# Patient Record
Sex: Male | Born: 1995
Health system: Southern US, Community
[De-identification: ages and names within clinical notes are randomized; demographics above are authoritative.]

## PROBLEM LIST (undated history)

## (undated) DIAGNOSIS — J45909 Unspecified asthma, uncomplicated: Secondary | ICD-10-CM

## (undated) DIAGNOSIS — F988 Other specified behavioral and emotional disorders with onset usually occurring in childhood and adolescence: Secondary | ICD-10-CM

## (undated) HISTORY — DX: Other specified behavioral and emotional disorders with onset usually occurring in childhood and adolescence: F98.8

---
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
## 2016-01-25 ENCOUNTER — Other Ambulatory Visit: Payer: Self-pay | Admitting: Physician Assistant

## 2016-03-03 ENCOUNTER — Telehealth: Payer: Self-pay

## 2016-03-03 NOTE — Telephone Encounter (Signed)
Pt would like a refill on his amphetamine-dextroamphetamine (ADDERALL) 20 MG tablet [[id number]][[id number]]. Please advise at [phone]

## 2016-03-06 NOTE — Telephone Encounter (Signed)
i see 3 rx starting 12/29 Not time yet

## 2016-03-06 NOTE — Telephone Encounter (Signed)
Unable to reach.

## 2016-03-14 ENCOUNTER — Telehealth: Payer: Self-pay

## 2016-03-14 NOTE — Telephone Encounter (Signed)
Pt is need a refill on his adderall   Best number [phone]

## 2016-03-14 NOTE — Telephone Encounter (Signed)
Due march 29

## 2016-03-21 NOTE — Telephone Encounter (Signed)
Looking back in his chart, he was last seen in October He has now had 6 months worth of medication and is due for a follow-up before refills I can see him on Friday or Monday Let him know that I retire in May I can set suggest places for him to follow-up after my retirement--in particular [facility] in [place]

## 2016-03-22 ENCOUNTER — Telehealth: Payer: Self-pay

## 2016-03-22 NOTE — Telephone Encounter (Signed)
Patient's brother called to clarify that the patient is out of the country, currently at school in [place], and he cannot come in for a follow up.  There is no ETA on when he will return to [place].  Please advise on how to handle the Adderall refills.  Thank you.  CB#: [phone]

## 2016-03-22 NOTE — Telephone Encounter (Signed)
Called pt, unable to leave voicemail

## 2016-03-22 NOTE — Telephone Encounter (Signed)
He has to come in. Correct Dr. Merla Richesoolittle?

## 2016-03-23 MED ORDER — AMPHETAMINE-DEXTROAMPHETAMINE 20 MG PO TABS: 20.0000 mg | ORAL_TABLET | Freq: Two times a day (BID) | ORAL | Status: DC

## 2016-03-23 NOTE — Telephone Encounter (Signed)
Meds ordered this encounter  Medications  . amphetamine-dextroamphetamine (ADDERALL) 20 MG tablet    Sig: Take 1 tablet (20 mg total) by mouth 2 (two) times daily.    Dispense:  60 tablet    Refill:  0   So--next rx will have to be handled in whatever country he's living in Remind them i am retiring and need to refer them to a new ADD specialist at the [facility] in May

## 2016-03-23 NOTE — Telephone Encounter (Signed)
Tried to call brother to advise Rx is ready and give Dr Doolittle's message but VM was full. His voice message allowed me to leave my ph #, so hopefully he will call back.

## 2016-07-12 ENCOUNTER — Ambulatory Visit: Payer: Self-pay

## 2016-09-26 ENCOUNTER — Ambulatory Visit: Payer: BLUE CROSS/BLUE SHIELD

## 2016-11-08 ENCOUNTER — Ambulatory Visit (INDEPENDENT_AMBULATORY_CARE_PROVIDER_SITE_OTHER): Payer: Self-pay | Admitting: Physician Assistant

## 2016-11-08 VITALS — BP 112/80 | HR 70 | Temp 98.4°F | Resp 16 | Ht 68.0 in | Wt 178.0 lb

## 2016-11-08 DIAGNOSIS — F988 Other specified behavioral and emotional disorders with onset usually occurring in childhood and adolescence: Secondary | ICD-10-CM

## 2016-11-08 MED ORDER — AMPHETAMINE-DEXTROAMPHETAMINE 20 MG PO TABS: 20.0000 mg | ORAL_TABLET | Freq: Two times a day (BID) | ORAL | 0 refills | Status: DC

## 2016-11-08 NOTE — Patient Instructions (Addendum)
  Follow up with me in one month for prescription refill. I have appointments on Wednesday afternoons in our adjacent building starting in December. Therefore you can either make an appointment for December 6 or 13th depending on how you take the adderall this month.   I would start with just taking half a tablet twice a day and adjust dose accordingly. If you have any questions please let us know.   Thank you for letting me participate in your health and well being.    IF you received an x-ray today, you will receive an invoice from [facility]. Please contact [facility] at [phone] with questions or concerns regarding your invoice.   IF you received labwork today, you will receive an invoice from [facility]. Please contact [facility] at [phone] with questions or concerns regarding your invoice.   Our billing staff will not be able to assist you with questions regarding bills from these companies.  You will be contacted with the lab results as soon as they are available. The fastest way to get your results is to activate your My Chart account. Instructions are located on the last page of this paperwork. If you have not heard from us regarding the results in 2 weeks, please contact this office.

## 2016-11-08 NOTE — Progress Notes (Signed)
   Mitchell Nelson  MRN: [id number] DOB: [birth year]  Subjective:  Mitchell Nelson is a 21 y.o. male seen in office today for a chief complaint of medication refill for Adderral. He has been taking adderall for ADD since 2015. Initially started on 10mg  BID and increased to a maintenance dose of 20mg  BID. He took a semester off of school to go visit family in [place] so he has not a prescription refilled since 03/23/2016. Pt is working at [employer] and will start school back in January at [employer] as a Sophomore so he would like to be back on the medication. He has no other concerns or complaints today.   Review of Systems  Constitutional: Negative for chills, diaphoresis and fever.  HENT: Negative for congestion.   Respiratory: Negative for cough.   Cardiovascular: Negative for chest pain and palpitations.  Gastrointestinal: Negative for nausea and vomiting.    Patient Active Problem List   Diagnosis Date Noted  . ADD (attention deficit disorder) 03/28/2015    Current Outpatient Prescriptions on File Prior to Visit  Medication Sig Dispense Refill  . amphetamine-dextroamphetamine (ADDERALL) 20 MG tablet Take 1 tablet (20 mg total) by mouth 2 (two) times daily. For 60d after signed 60 tablet 0  . amphetamine-dextroamphetamine (ADDERALL) 20 MG tablet Take 1 tablet (20 mg total) by mouth 2 (two) times daily. For 30d after signed 60 tablet 0  . amphetamine-dextroamphetamine (ADDERALL) 20 MG tablet Take 1 tablet (20 mg total) by mouth 2 (two) times daily. 60 tablet 0   No current facility-administered medications on file prior to visit.     No Known Allergies   Objective:  BP 112/80 (BP Location: Right Arm, Patient Position: Sitting, Cuff Size: Normal)   Pulse 70   Temp 98.4 F (36.9 C) (Oral)   Resp 16   Ht 5\' 8"  (1.727 m)   Wt 178 lb (80.7 kg)   SpO2 99%   BMI 27.06 kg/m   Physical Exam  Constitutional: He is oriented to person, place, and time and well-developed,  well-nourished, and in no distress.  HENT:  Head: Normocephalic and atraumatic.  Eyes: Conjunctivae are normal.  Neck: Normal range of motion.  Cardiovascular: Normal rate, regular rhythm, normal heart sounds and intact distal pulses.   Pulmonary/Chest: Effort normal and breath sounds normal.  Neurological: He is alert and oriented to person, place, and time. Gait normal.  Skin: Skin is warm and dry.  Psychiatric: Affect normal.  Vitals reviewed.   Assessment and Plan :  1. Attention deficit disorder, unspecified hyperactivity presence - amphetamine-dextroamphetamine (ADDERALL) 20 MG tablet; Take 1 tablet (20 mg total) by mouth 2 (two) times daily.  Dispense: 60 tablet; Refill: 0 -Pt instructed to start out with 10mg  BID as he has not taken it in 8 months and he can increase up to 20mg  BID.  -Pt to follow up with me in 4 weeks for prescription refill. Will give him three months worth of prescriptions at this visit if he is tolerating medication appropriately.   Benjiman CoreBrittany Wiseman PA-C  [facility] 11/08/2016 12:20 PM

## 2016-12-21 ENCOUNTER — Ambulatory Visit (INDEPENDENT_AMBULATORY_CARE_PROVIDER_SITE_OTHER): Payer: Self-pay | Admitting: Physician Assistant

## 2016-12-21 VITALS — BP 114/68 | HR 60 | Temp 98.5°F | Resp 18 | Ht 68.0 in | Wt 178.0 lb

## 2016-12-21 DIAGNOSIS — F988 Other specified behavioral and emotional disorders with onset usually occurring in childhood and adolescence: Secondary | ICD-10-CM

## 2016-12-21 MED ORDER — AMPHETAMINE-DEXTROAMPHETAMINE 20 MG PO TABS: 20.0000 mg | ORAL_TABLET | Freq: Two times a day (BID) | ORAL | 0 refills | Status: DC

## 2016-12-21 NOTE — Progress Notes (Signed)
Mitchell Nelson  MRN: [id number] DOB: Dec 08, 1995  Subjective:  Mitchell Nelson is a [age] y.o. male seen in office today for a chief complaint of medication refill for Adderall 20mg  BID for ADD. Pt initially seen by me on 11/08/16 and I restarted Adderall 20mg  BID (by titrating up from 10mg  BID) after he took a semester off from school to visit his family in [place]. Pt is working at [employer] and is starting back school in January at [employer]. Pt notes he is taking it as prescribed. He will sometimes not take it on the weekends if he is not working. Denies decreased appetite, palpitations, nausea, and vomiting.   Review of Systems  Constitutional: Negative for chills, diaphoresis, fatigue and fever.  Respiratory: Negative for cough and shortness of breath.   Cardiovascular: Negative for chest pain.  Gastrointestinal: Negative for abdominal pain, constipation and diarrhea.  Neurological: Negative for dizziness and headaches.    Patient Active Problem List   Diagnosis Date Noted  . ADD (attention deficit disorder) 03/28/2015    Current Outpatient Prescriptions on File Prior to Visit  Medication Sig Dispense Refill  . amphetamine-dextroamphetamine (ADDERALL) 20 MG tablet Take 1 tablet (20 mg total) by mouth 2 (two) times daily. For 30d after signed 60 tablet 0  . amphetamine-dextroamphetamine (ADDERALL) 20 MG tablet Take 1 tablet (20 mg total) by mouth 2 (two) times daily. 60 tablet 0  . amphetamine-dextroamphetamine (ADDERALL) 20 MG tablet Take 1 tablet (20 mg total) by mouth 2 (two) times daily. 60 tablet 0   No current facility-administered medications on file prior to visit.     No Known Allergies   Objective:  BP 114/68 (BP Location: Right Arm, Patient Position: Sitting, Cuff Size: Small)   Pulse 60   Temp 98.5 F (36.9 C) (Oral)   Resp 18   Ht 5\' 8"  (1.727 m)   Wt 178 lb (80.7 kg)   SpO2 97%   BMI 27.06 kg/m   Physical Exam  Constitutional: He is oriented to person,  place, and time and well-developed, well-nourished, and in no distress.  HENT:  Head: Normocephalic and atraumatic.  Eyes: Conjunctivae are normal.  Neck: Normal range of motion.  Cardiovascular: Normal rate, regular rhythm and normal heart sounds.   Pulmonary/Chest: Effort normal and breath sounds normal.  Neurological: He is alert and oriented to person, place, and time. Gait normal.  Skin: Skin is warm and dry.  Psychiatric: Affect normal.  Vitals reviewed.    Wt Readings from Last 3 Encounters:  12/21/16 178 lb (80.7 kg)  11/08/16 178 lb (80.7 kg)  10/03/15 173 lb 9.6 oz (78.7 kg) (78 %, Z= 0.77)*   * Growth percentiles are based on CDC 2-20 Years data.   Assessment and Plan :  1. Attention deficit disorder, unspecified hyperactivity presence Given 3 month prescription. Follow up in 3 months for refills. Instructed to return sooner for annual physical exam as he has not had an annual in quite some time.  Meds ordered this encounter  Medications  . DISCONTD: amphetamine-dextroamphetamine (ADDERALL) 20 MG tablet    Sig: Take 1 tablet (20 mg total) by mouth 2 (two) times daily. For 30d after signed    Dispense:  60 tablet    Refill:  0    Order Specific Question:   Supervising Provider    Answer:   Ethelda ChickSMITH, KRISTI M [[id number]]  . amphetamine-dextroamphetamine (ADDERALL) 20 MG tablet    Sig: Take 1 tablet (20 mg total) by mouth 2 (  two) times daily.    Dispense:  60 tablet    Refill:  0    Do not refill before 01/21/2017    Order Specific Question:   Supervising Provider    Answer:   Ethelda ChickSMITH, KRISTI M [[id number]]  . amphetamine-dextroamphetamine (ADDERALL) 20 MG tablet    Sig: Take 1 tablet (20 mg total) by mouth 2 (two) times daily. For 30d after signed    Dispense:  60 tablet    Refill:  0    Do not refill before 02/21/2017    Order Specific Question:   Supervising Provider    Answer:   Nilda SimmerSMITH, KRISTI M [[id number]]     Mitchell CoreBrittany Kaylib Furness PA-C  [facility] 12/21/2016 5:44 PM

## 2016-12-21 NOTE — Patient Instructions (Addendum)
Follow up in 3 months for prescription refills. Return sooner for annual physical exam if you would like.   Happy holidays!!!     IF you received an x-ray today, you will receive an invoice from [facility]. Please contact [facility] at [phone] with questions or concerns regarding your invoice.   IF you received labwork today, you will receive an invoice from [facility]. Please contact [facility] at [phone] with questions or concerns regarding your invoice.   Our billing staff will not be able to assist you with questions regarding bills from these companies.  You will be contacted with the lab results as soon as they are available. The fastest way to get your results is to activate your My Chart account. Instructions are located on the last page of this paperwork. If you have not heard from us regarding the results in 2 weeks, please contact this office.

## 2017-01-12 ENCOUNTER — Encounter (HOSPITAL_COMMUNITY): Payer: Self-pay

## 2017-01-12 DIAGNOSIS — Y999 Unspecified external cause status: Secondary | ICD-10-CM | POA: Insufficient documentation

## 2017-01-12 DIAGNOSIS — W268XXA Contact with other sharp object(s), not elsewhere classified, initial encounter: Secondary | ICD-10-CM | POA: Insufficient documentation

## 2017-01-12 DIAGNOSIS — Z5321 Procedure and treatment not carried out due to patient leaving prior to being seen by health care provider: Secondary | ICD-10-CM | POA: Insufficient documentation

## 2017-01-12 DIAGNOSIS — Y9367 Activity, basketball: Secondary | ICD-10-CM | POA: Insufficient documentation

## 2017-01-12 DIAGNOSIS — S01121A Laceration with foreign body of right eyelid and periocular area, initial encounter: Secondary | ICD-10-CM | POA: Insufficient documentation

## 2017-01-12 DIAGNOSIS — Y929 Unspecified place or not applicable: Secondary | ICD-10-CM | POA: Insufficient documentation

## 2017-01-12 NOTE — ED Triage Notes (Signed)
Pt states that he was playing basketball and fell on his R side, pt has small laceration to his R eyebrow area from his glasses, unknown LOC, pain to his r shoulder and unable to move, no deformities noted.

## 2017-01-13 ENCOUNTER — Emergency Department (HOSPITAL_COMMUNITY)
Admission: EM | Admit: 2017-01-13 | Discharge: 2017-01-13 | Disposition: A | Discharge status: Home / Self Care | Payer: Self-pay | Attending: Emergency Medicine | Admitting: Emergency Medicine

## 2017-01-13 ENCOUNTER — Emergency Department (HOSPITAL_COMMUNITY): Payer: Self-pay

## 2017-01-13 NOTE — ED Notes (Signed)
Patient walked out with friends.

## 2017-02-25 ENCOUNTER — Telehealth: Payer: Self-pay

## 2017-02-25 NOTE — Telephone Encounter (Signed)
Patient came into clinic on 02/25/17 stating that he lost his amphetamine-dextroamphetamine (ADDERALL) 20 MG tablet and needs to get it filled again.  His call back number is [phone]

## 2017-02-25 NOTE — Telephone Encounter (Signed)
12/21/17 last 2 refills given

## 2017-02-26 NOTE — Telephone Encounter (Signed)
Pt checking on status of this message, please advise.

## 2017-02-27 NOTE — Telephone Encounter (Signed)
Patient was given a follow up appt with Barnett AbuWiseman for tomorrow

## 2017-02-28 ENCOUNTER — Ambulatory Visit (INDEPENDENT_AMBULATORY_CARE_PROVIDER_SITE_OTHER): Payer: Self-pay | Admitting: Physician Assistant

## 2017-02-28 DIAGNOSIS — F988 Other specified behavioral and emotional disorders with onset usually occurring in childhood and adolescence: Secondary | ICD-10-CM

## 2017-02-28 MED ORDER — AMPHETAMINE-DEXTROAMPHETAMINE 20 MG PO TABS: 20.0000 mg | ORAL_TABLET | Freq: Two times a day (BID) | ORAL | 0 refills | Status: DC

## 2017-02-28 NOTE — Patient Instructions (Addendum)
  Follow up in 3 months for annual physical exam with me.  In the meantime, come up with three things you are interested in majoring in at school and let me know at your next visit.  Keep up the hard work!!! Thank you for letting me participate in your health and well being.    IF you received an x-ray today, you will receive an invoice from [facility]. Please contact [facility] at [phone] with questions or concerns regarding your invoice.   IF you received labwork today, you will receive an invoice from [facility]. Please contact [facility] at [phone] with questions or concerns regarding your invoice.   Our billing staff will not be able to assist you with questions regarding bills from these companies.  You will be contacted with the lab results as soon as they are available. The fastest way to get your results is to activate your My Chart account. Instructions are located on the last page of this paperwork. If you have not heard from us regarding the results in 2 weeks, please contact this office.

## 2017-02-28 NOTE — Progress Notes (Signed)
   Mitchell Nelson  MRN: [id number] DOB: 1995-06-30  Subjective:  Mitchell Nelson is a 22 y.o. male seen in office today for a chief complaint of medication refill for Adderall 20mg  BID for ADD. Pt last seen on 12/21/17 and was given three months worth of prescriptions. However, he lost the rx that was supposed to be filled on 02/21/17. States his brother cleaned out his car and must have thrown it away. Pt is working at [employer] and is starting back school in June at [employer]. Denies decreased appetite, palpitations, nausea, and vomiting.    Review of Systems  Per HPI  Patient Active Problem List   Diagnosis Date Noted  . ADD (attention deficit disorder) 03/28/2015    Current Outpatient Prescriptions on File Prior to Visit  Medication Sig Dispense Refill  . amphetamine-dextroamphetamine (ADDERALL) 20 MG tablet Take 1 tablet (20 mg total) by mouth 2 (two) times daily. 60 tablet 0  . amphetamine-dextroamphetamine (ADDERALL) 20 MG tablet Take 1 tablet (20 mg total) by mouth 2 (two) times daily. 60 tablet 0  . amphetamine-dextroamphetamine (ADDERALL) 20 MG tablet Take 1 tablet (20 mg total) by mouth 2 (two) times daily. For 30d after signed 60 tablet 0   No current facility-administered medications on file prior to visit.     No Known Allergies   Objective:  There were no vitals taken for this visit.  Physical Exam  Constitutional: He is oriented to person, place, and time and well-developed, well-nourished, and in no distress.  HENT:  Head: Normocephalic and atraumatic.  Eyes: Conjunctivae are normal.  Neck: Normal range of motion.  Cardiovascular: Normal rate, regular rhythm and normal heart sounds.   Pulmonary/Chest: Effort normal.  Neurological: He is alert and oriented to person, place, and time. Gait normal.  Skin: Skin is warm and dry.  Psychiatric: Affect normal.  Vitals reviewed.  Assessment and Plan :  1. Attention deficit disorder, unspecified hyperactivity  presence -[place] Controlled Substance Registry reviewed and consistent with patient's history. Will give additional 3 months rx. Pt informed to return in 3 months for CPE and medication refill at that time.    - amphetamine-dextroamphetamine (ADDERALL) 20 MG tablet; Take 1 tablet (20 mg total) by mouth 2 (two) times daily.  Dispense: 60 tablet; Refill: 0 - amphetamine-dextroamphetamine (ADDERALL) 20 MG tablet; Take 1 tablet (20 mg total) by mouth 2 (two) times daily.  Dispense: 60 tablet; Refill: 0 - amphetamine-dextroamphetamine (ADDERALL) 20 MG tablet; Take 1 tablet (20 mg total) by mouth 2 (two) times daily.  Dispense: 60 tablet; Refill: 0  Benjiman CoreBrittany Johnluke Haugen PA-C  [facility] 02/28/2017 3:33 PM

## 2017-03-06 ENCOUNTER — Encounter (HOSPITAL_COMMUNITY): Payer: Self-pay | Admitting: *Deleted

## 2017-03-06 DIAGNOSIS — Z5321 Procedure and treatment not carried out due to patient leaving prior to being seen by health care provider: Secondary | ICD-10-CM | POA: Insufficient documentation

## 2017-03-06 DIAGNOSIS — M79632 Pain in left forearm: Secondary | ICD-10-CM | POA: Insufficient documentation

## 2017-03-06 NOTE — ED Triage Notes (Signed)
The pt has pain in his lt forearm and hand while pumping gas  20 minutes ago  No visible swelling

## 2017-03-07 ENCOUNTER — Emergency Department (HOSPITAL_COMMUNITY)
Admission: EM | Admit: 2017-03-07 | Discharge: 2017-03-07 | Disposition: A | Discharge status: Home / Self Care | Payer: Self-pay | Attending: Dermatology | Admitting: Dermatology

## 2017-03-07 NOTE — ED Notes (Signed)
The pt was called but no answer x 3

## 2017-06-11 ENCOUNTER — Ambulatory Visit: Payer: Self-pay | Admitting: Physician Assistant

## 2017-06-18 ENCOUNTER — Ambulatory Visit (INDEPENDENT_AMBULATORY_CARE_PROVIDER_SITE_OTHER): Payer: BLUE CROSS/BLUE SHIELD | Admitting: Physician Assistant

## 2017-06-18 ENCOUNTER — Encounter: Payer: Self-pay | Admitting: Physician Assistant

## 2017-06-18 DIAGNOSIS — F988 Other specified behavioral and emotional disorders with onset usually occurring in childhood and adolescence: Secondary | ICD-10-CM | POA: Diagnosis not present

## 2017-06-18 MED ORDER — AMPHETAMINE-DEXTROAMPHETAMINE 20 MG PO TABS: 20.0000 mg | ORAL_TABLET | Freq: Two times a day (BID) | ORAL | 0 refills | Status: DC

## 2017-06-18 MED ORDER — AMPHETAMINE-DEXTROAMPHETAMINE 20 MG PO TABS
20.0000 mg | ORAL_TABLET | Freq: Two times a day (BID) | ORAL | 0 refills | Status: DC
Start: 2017-06-18 — End: 2017-11-25

## 2017-06-18 NOTE — Progress Notes (Signed)
   Mitchell Nelson  MRN: [id number] DOB: 1995-07-06  Subjective:  Mitchell Nelson is a 22 y.o. male seen in office today for a chief complaint of medication refill for Adderall 20mg  BID for ADD. Pt last seen on 02/28/17 and was given three months worth of prescriptions. Notes he feels well controlled on this dose and is tolerating the medication well. He typically takes it daily but has skipped some days this for the past month because of Ramadan, which just ended. Pt is still working at [employer]. He just finished registering for classes at [employer], which start in 07/2017. 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  Denies decreased appetite, palpitations, dizziness, headache, insomnia, nausea, and vomiting.   Review of Systems  Per HPI  Patient Active Problem List   Diagnosis Date Noted  . ADD (attention deficit disorder) 03/28/2015    Current Outpatient Prescriptions on File Prior to Visit  Medication Sig Dispense Refill  . amphetamine-dextroamphetamine (ADDERALL) 20 MG tablet Take 1 tablet (20 mg total) by mouth 2 (two) times daily. 60 tablet 0  . amphetamine-dextroamphetamine (ADDERALL) 20 MG tablet Take 1 tablet (20 mg total) by mouth 2 (two) times daily. 60 tablet 0  . amphetamine-dextroamphetamine (ADDERALL) 20 MG tablet Take 1 tablet (20 mg total) by mouth 2 (two) times daily. For 30d after signed 60 tablet 0   No current facility-administered medications on file prior to visit.     No Known Allergies   Objective:  BP 120/73 (BP Location: Right Arm, Patient Position: Sitting, Cuff Size: Small)   Pulse 60   Temp 98.2 F (36.8 C) (Oral)   Resp 18   Ht 5' 8.31" (1.735 m)   Wt 183 lb 9.6 oz (83.3 kg)   SpO2 98%   BMI 27.67 kg/m   Physical Exam  Constitutional: He is oriented to person, place, and time and well-developed, well-nourished, and in no distress.  HENT:  Head: Normocephalic and atraumatic.  Eyes: Conjunctivae are normal.  Neck: Normal range of motion.    Cardiovascular: Normal rate, regular rhythm and normal heart sounds.   Pulmonary/Chest: Effort normal and breath sounds normal.  Neurological: He is alert and oriented to person, place, and time. Gait normal.  Skin: Skin is warm and dry.  Psychiatric: Affect normal.  Vitals reviewed.   Assessment and Plan :  1. Attention deficit disorder, unspecified hyperactivity presence Well controlled. Plan for follow up in 3 months for medication refill.   - amphetamine-dextroamphetamine (ADDERALL) 20 MG tablet; Take 1 tablet (20 mg total) by mouth 2 (two) times daily.  Dispense: 60 tablet; Refill: 0 - amphetamine-dextroamphetamine (ADDERALL) 20 MG tablet; Take 1 tablet (20 mg total) by mouth 2 (two) times daily.  Dispense: 60 tablet; Refill: 0 - amphetamine-dextroamphetamine (ADDERALL) 20 MG tablet; Take 1 tablet (20 mg total) by mouth 2 (two) times daily.  Dispense: 60 tablet; Refill: 0  Benjiman CoreBrittany Deontre Allsup, PA-C  [facility] 06/18/2017 3:08 PM

## 2017-06-18 NOTE — Patient Instructions (Addendum)
Follow up in 3 months for prescription refills. Return sooner for a complete physical exam if you can! Good luck in school, you are going to do great!!! Thank you for letting me participate in your health and well being.     IF you received an x-ray today, you will receive an invoice from [facility]. Please contact [facility] at [phone] with questions or concerns regarding your invoice.   IF you received labwork today, you will receive an invoice from [facility]. Please contact [facility] at [phone] with questions or concerns regarding your invoice.   Our billing staff will not be able to assist you with questions regarding bills from these companies.  You will be contacted with the lab results as soon as they are available. The fastest way to get your results is to activate your My Chart account. Instructions are located on the last page of this paperwork. If you have not heard from us regarding the results in 2 weeks, please contact this office.

## 2017-08-17 ENCOUNTER — Telehealth: Payer: Self-pay | Admitting: Physician Assistant

## 2017-08-17 NOTE — Telephone Encounter (Signed)
Patient came in 08/16/2017 looking to have an additional two month supply of medication for ADHD as he was leaving the country. Called 08/17/2017 to ask if request had been filled.

## 2017-11-25 ENCOUNTER — Other Ambulatory Visit: Payer: Self-pay

## 2017-11-25 ENCOUNTER — Ambulatory Visit (INDEPENDENT_AMBULATORY_CARE_PROVIDER_SITE_OTHER): Payer: BLUE CROSS/BLUE SHIELD | Admitting: Physician Assistant

## 2017-11-25 ENCOUNTER — Encounter: Payer: Self-pay | Admitting: Physician Assistant

## 2017-11-25 DIAGNOSIS — F988 Other specified behavioral and emotional disorders with onset usually occurring in childhood and adolescence: Secondary | ICD-10-CM

## 2017-11-25 MED ORDER — AMPHETAMINE-DEXTROAMPHETAMINE 20 MG PO TABS: 20.0000 mg | ORAL_TABLET | Freq: Two times a day (BID) | ORAL | 0 refills | Status: DC

## 2017-11-25 NOTE — Progress Notes (Signed)
   Mitchell Nelson  MRN: [id number] DOB: 06-30-1995  Subjective:  Mitchell Nelson is a 22 y.o. male seen in office today for a chief complaint of medication refill for Adderall 20 mg BID for ADD.  Patient last seen by me for this condition on 06/18/17.  He went to [place] in 07/2017 to visit family and friends for 3 months and did not use medication while he was there.  Now that he is back, he is about to start classes at [employer] in 12/2017.  His plan is to get an associate's in science and then transfer to [employer].  He is not currently working.  He typically takes medication 5 days a week but will occasionally take it on weekends. While he is on the medication, he denies decreased appetite, palpitations, dizziness, headache, insomnia, nausea, and vomiting.  Has no other questions or concerns.  Review of Systems  Constitutional: Negative for chills, diaphoresis and fever.  Cardiovascular: Negative for chest pain.    Patient Active Problem List   Diagnosis Date Noted  . ADD (attention deficit disorder) 03/28/2015    Current Outpatient Medications on File Prior to Visit  Medication Sig Dispense Refill  . amphetamine-dextroamphetamine (ADDERALL) 20 MG tablet Take 1 tablet (20 mg total) by mouth 2 (two) times daily. 60 tablet 0  . amphetamine-dextroamphetamine (ADDERALL) 20 MG tablet Take 1 tablet (20 mg total) by mouth 2 (two) times daily. 60 tablet 0  . amphetamine-dextroamphetamine (ADDERALL) 20 MG tablet Take 1 tablet (20 mg total) by mouth 2 (two) times daily. For 30d after signed 60 tablet 0   No current facility-administered medications on file prior to visit.     No Known Allergies   Objective:  BP 132/68 (BP Location: Left Arm, Patient Position: Sitting, Cuff Size: Normal)   Pulse 80   Temp 98.3 F (36.8 C) (Oral)   Resp 18   Ht 5' 8.9" (1.75 m)   Wt 184 lb (83.5 kg)   SpO2 100%   BMI 27.25 kg/m   Physical Exam  Constitutional: He is oriented to person, place, and time and  well-developed, well-nourished, and in no distress.  HENT:  Head: Normocephalic and atraumatic.  Eyes: Conjunctivae are normal.  Neck: Normal range of motion.  Cardiovascular: Normal rate, regular rhythm and normal heart sounds.  Pulmonary/Chest: Effort normal.  Neurological: He is alert and oriented to person, place, and time. Gait normal.  Skin: Skin is warm and dry.  Psychiatric: Affect normal.  Vitals reviewed.   Assessment and Plan :  1. Attention deficit disorder, unspecified hyperactivity presence Plan to restart Adderall before pt starts classes.  Follow-up in 3 months for CPE and medication refill.  - amphetamine-dextroamphetamine (ADDERALL) 20 MG tablet; Take 1 tablet (20 mg total) by mouth 2 (two) times daily.  Dispense: 60 tablet; Refill: 0 - amphetamine-dextroamphetamine (ADDERALL) 20 MG tablet; Take 1 tablet (20 mg total) by mouth 2 (two) times daily.  Dispense: 60 tablet; Refill: 0 - amphetamine-dextroamphetamine (ADDERALL) 20 MG tablet; Take 1 tablet (20 mg total) by mouth 2 (two) times daily.  Dispense: 60 tablet; Refill: 0  Benjiman CoreBrittany Wiseman PA-C  [facility]  [facility] 11/25/2017 1:59 PM

## 2017-11-25 NOTE — Patient Instructions (Addendum)
It was great seeing you today.  Good luck with school, you are going to do great! Plan to follow up with me in either February or March for medication refill and complete physical exam. It is best to be fasting (no food or drink for 8 hours prior to appointment) that day so we can obtain labs. Thank you for letting me participate in your health and well being.    IF you received an x-ray today, you will receive an invoice from [facility]. Please contact [facility] at [phone] with questions or concerns regarding your invoice.   IF you received labwork today, you will receive an invoice from [facility]. Please contact [facility] at [phone] with questions or concerns regarding your invoice.   Our billing staff will not be able to assist you with questions regarding bills from these companies.  You will be contacted with the lab results as soon as they are available. The fastest way to get your results is to activate your My Chart account. Instructions are located on the last page of this paperwork. If you have not heard from us regarding the results in 2 weeks, please contact this office.

## 2018-02-27 ENCOUNTER — Encounter: Payer: Self-pay | Admitting: Physician Assistant

## 2018-02-27 ENCOUNTER — Ambulatory Visit (INDEPENDENT_AMBULATORY_CARE_PROVIDER_SITE_OTHER): Payer: BLUE CROSS/BLUE SHIELD | Admitting: Physician Assistant

## 2018-02-27 ENCOUNTER — Other Ambulatory Visit: Payer: Self-pay

## 2018-02-27 DIAGNOSIS — F988 Other specified behavioral and emotional disorders with onset usually occurring in childhood and adolescence: Secondary | ICD-10-CM

## 2018-02-27 MED ORDER — AMPHETAMINE-DEXTROAMPHETAMINE 20 MG PO TABS: 20.0000 mg | ORAL_TABLET | Freq: Two times a day (BID) | ORAL | 0 refills | Status: DC

## 2018-02-27 NOTE — Patient Instructions (Addendum)
  It was a pleasure seeing you today.  I have sent your refills to your pharmacy.  Please follow-up in 3 months for complete physical exam and medication refill.  Good luck with your midterms!   IF you received an x-ray today, you will receive an invoice from [facility]. Please contact [facility] at [phone] with questions or concerns regarding your invoice.   IF you received labwork today, you will receive an invoice from [facility]. Please contact [facility] at [phone] with questions or concerns regarding your invoice.   Our billing staff will not be able to assist you with questions regarding bills from these companies.  You will be contacted with the lab results as soon as they are available. The fastest way to get your results is to activate your My Chart account. Instructions are located on the last page of this paperwork. If you have not heard from us regarding the results in 2 weeks, please contact this office.

## 2018-02-27 NOTE — Progress Notes (Signed)
   Mitchell Nelson  MRN: [id number] DOB: [birth year]  Subjective:  Mitchell Nelson is a 23 y.o. male seen in office today for a chief complaint of medication refill for Adderall 20 mg BID for ADD.  Patient last seen by me for this condition on 10/2617.  He has started classes at [employer] sine his last visit. He is doing well in all of his classes. He typically takes medication 5 days a week but will occasionally take it on weekends. Denies decreased appetite, palpitations, dizziness, headache, insomnia, nausea, and vomiting.  Has no other questions or concerns.  Review of Systems  Constitutional: Negative for chills, diaphoresis and fever.    Patient Active Problem List   Diagnosis Date Noted  . ADD (attention deficit disorder) 03/28/2015    Current Outpatient Medications on File Prior to Visit  Medication Sig Dispense Refill  . amphetamine-dextroamphetamine (ADDERALL) 20 MG tablet Take 1 tablet (20 mg total) by mouth 2 (two) times daily. 60 tablet 0  . amphetamine-dextroamphetamine (ADDERALL) 20 MG tablet Take 1 tablet (20 mg total) by mouth 2 (two) times daily. 60 tablet 0  . amphetamine-dextroamphetamine (ADDERALL) 20 MG tablet Take 1 tablet (20 mg total) by mouth 2 (two) times daily. For 30d after signed 60 tablet 0   No current facility-administered medications on file prior to visit.     No Known Allergies   Objective:  BP 118/78 (BP Location: Left Arm, Patient Position: Sitting, Cuff Size: Normal)   Pulse 94   Temp 98 F (36.7 C) (Oral)   Resp 18   Ht 5' 8.27" (1.734 m)   Wt 184 lb 6.4 oz (83.6 kg)   SpO2 99%   BMI 27.82 kg/m   Physical Exam  Constitutional: He is oriented to person, place, and time and well-developed, well-nourished, and in no distress.  HENT:  Head: Normocephalic and atraumatic.  Eyes: Conjunctivae are normal.  Neck: Normal range of motion.  Pulmonary/Chest: Effort normal.  Neurological: He is alert and oriented to person, place, and time. Gait  normal.  Skin: Skin is warm and dry.  Psychiatric: Affect normal.  Vitals reviewed.   Assessment and Plan :  1. Attention deficit disorder, unspecified hyperactivity presence [place] Controlled Substance Database Registry reviewed and consistent with patient's hx. Recommend he follow up in 3 months for CPE and medication refill.  - amphetamine-dextroamphetamine (ADDERALL) 20 MG tablet; Take 1 tablet (20 mg total) by mouth 2 (two) times daily.  Dispense: 60 tablet; Refill: 0 - amphetamine-dextroamphetamine (ADDERALL) 20 MG tablet; Take 1 tablet (20 mg total) by mouth 2 (two) times daily.  Dispense: 60 tablet; Refill: 0 - amphetamine-dextroamphetamine (ADDERALL) 20 MG tablet; Take 1 tablet (20 mg total) by mouth 2 (two) times daily. For 30d after signed  Dispense: 60 tablet; Refill: 0   Benjiman CoreBrittany Wiseman PA-C  [facility] at [facility]  [facility] 02/27/2018 5:35 PM

## 2018-06-02 ENCOUNTER — Ambulatory Visit (INDEPENDENT_AMBULATORY_CARE_PROVIDER_SITE_OTHER): Payer: BLUE CROSS/BLUE SHIELD | Admitting: Physician Assistant

## 2018-06-02 ENCOUNTER — Encounter: Payer: Self-pay | Admitting: Physician Assistant

## 2018-06-02 ENCOUNTER — Telehealth: Payer: Self-pay | Admitting: Physician Assistant

## 2018-06-02 ENCOUNTER — Ambulatory Visit: Payer: BLUE CROSS/BLUE SHIELD | Admitting: Physician Assistant

## 2018-06-02 ENCOUNTER — Other Ambulatory Visit: Payer: Self-pay

## 2018-06-02 DIAGNOSIS — F988 Other specified behavioral and emotional disorders with onset usually occurring in childhood and adolescence: Secondary | ICD-10-CM

## 2018-06-02 MED ORDER — AMPHETAMINE-DEXTROAMPHETAMINE 20 MG PO TABS: 20.0000 mg | ORAL_TABLET | Freq: Two times a day (BID) | ORAL | 0 refills | Status: DC

## 2018-06-02 NOTE — Patient Instructions (Addendum)
Follow up when you are back from your trip for a complete physical exam. Have a great time and be safe!    IF you received an x-ray today, you will receive an invoice from [facility]. Please contact [facility] at [phone] with questions or concerns regarding your invoice.   IF you received labwork today, you will receive an invoice from [facility]. Please contact [facility] at [phone] with questions or concerns regarding your invoice.   Our billing staff will not be able to assist you with questions regarding bills from these companies.  You will be contacted with the lab results as soon as they are available. The fastest way to get your results is to activate your My Chart account. Instructions are located on the last page of this paperwork. If you have not heard from us regarding the results in 2 weeks, please contact this office.

## 2018-06-02 NOTE — Telephone Encounter (Signed)
Tried to call pt and make an appt with Wiseman forBarnett Nelson late rin the day today. We still have a few left (as of 10:15 am) . I asked him to call the office to make an appt if he still needed one. If he calls back, please make him an appt for a med refill with Mitchell AbuWiseman at his convenience. Thanks!

## 2018-06-02 NOTE — Progress Notes (Signed)
   Mitchell GaulMohamed Domique  MRN: [id number] DOB: 28-Apr-1995  Subjective:  Mitchell Nelson is a 23 y.o. male seen in office today for a chief complaint of medication refill for Adderall 20 mgBIDfor ADD. Patient last seen by me for this on 02/27/18. Just finished a semester at [employer] sine his last visit. He has decided to major in aviation. Plans to complete one more semester at [employer] and then transfer to [employer]. Made all B's this semester, notes it was very easy for him to do so, admits that he could have tried a little harder.  He typically takes medication 5 days a week but will occasionally take it on weekends. Denies decreased appetite, palpitations, dizziness, headache, insomnia, nausea, andvomiting. Denies illicit drug use. He is leaving for [place] tomorrow to go visit his family for 1.5 months and therefore would just like one month's worth of Rx at this time as he will not be taking medication daily while over there. Has no other questions or concerns.  Review of Systems  Per HPI  Patient Active Problem List   Diagnosis Date Noted  . ADD (attention deficit disorder) 03/28/2015    Current Outpatient Medications on File Prior to Visit  Medication Sig Dispense Refill  . amphetamine-dextroamphetamine (ADDERALL) 20 MG tablet Take 1 tablet (20 mg total) by mouth 2 (two) times daily. For 30d after signed 60 tablet 0   No current facility-administered medications on file prior to visit.     No Known Allergies   Objective:  BP 120/80 (BP Location: Left Arm, Patient Position: Sitting, Cuff Size: Normal)   Pulse (!) 56   Temp 99.4 F (37.4 C) (Oral)   Ht 5\' 9"  (1.753 m)   Wt 192 lb 12.8 oz (87.5 kg)   SpO2 96%   BMI 28.47 kg/m   Physical Exam  Constitutional: He is oriented to person, place, and time. He appears well-developed and well-nourished. No distress.  HENT:  Head: Normocephalic and atraumatic.  Eyes: Conjunctivae are normal.  Neck: Normal range of motion.  Pulmonary/Chest: Effort  normal.  Neurological: He is alert and oriented to person, place, and time.  Skin: Skin is warm and dry.  Psychiatric: He has a normal mood and affect.  Vitals reviewed.   Assessment and Plan :  1. Attention deficit disorder, unspecified hyperactivity presence Well controlled. [place] Controlled Substance Database Registry was reviewed and is consistent with patient's history. Rx given for one additional month. Plan to follow up when he returns from [place]. Will complete toxassure and CPE at that visit.  - amphetamine-dextroamphetamine (ADDERALL) 20 MG tablet; Take 1 tablet (20 mg total) by mouth 2 (two) times daily. For 30d after signed  Dispense: 60 tablet; Refill: 0   Benjiman CoreBrittany Evita Merida PA-C  [facility]  [facility] 06/02/2018 4:43 PM

## 2018-08-04 ENCOUNTER — Encounter: Payer: BLUE CROSS/BLUE SHIELD | Admitting: Physician Assistant

## 2018-08-04 NOTE — Progress Notes (Deleted)
   Mitchell Nelson  MRN: [id number] DOB: 1995/10/22  Subjective:  @NAMEBYAGE @ is a 23 y.o. male who presents for annual physical exam and ***.  Last dental exam:  Last vision exam: Last pap smear: Last mammogram: Last colonoscopy: Vaccinations      Tetanus      HPV      Zostavax  Patient Active Problem List   Diagnosis Date Noted  . ADD (attention deficit disorder) 03/28/2015    Current Outpatient Medications on File Prior to Visit  Medication Sig Dispense Refill  . amphetamine-dextroamphetamine (ADDERALL) 20 MG tablet Take 1 tablet (20 mg total) by mouth 2 (two) times daily. For 30d after signed 60 tablet 0   No current facility-administered medications on file prior to visit.     No Known Allergies  Social History   Socioeconomic History  . Marital status: Single    Spouse name: Not on file  . Number of children: Not on file  . Years of education: Not on file  . Highest education level: Not on file  Occupational History  . Not on file  Social Needs  . Financial resource strain: Not on file  . Food insecurity:    Worry: Not on file    Inability: Not on file  . Transportation needs:    Medical: Not on file    Non-medical: Not on file  Tobacco Use  . Smoking status: Current Every Day Smoker    Packs/day: 0.25  . Smokeless tobacco: Never Used  Substance and Sexual Activity  . Alcohol use: No    Alcohol/week: 0.0 oz  . Drug use: No  . Sexual activity: Yes  Lifestyle  . Physical activity:    Days per week: Not on file    Minutes per session: Not on file  . Stress: Not on file  Relationships  . Social connections:    Talks on phone: Not on file    Gets together: Not on file    Attends religious service: Not on file    Active member of club or organization: Not on file    Attends meetings of clubs or organizations: Not on file    Relationship status: Not on file  Other Topics Concern  . Not on file  Social History Narrative  . Not on file    No  past surgical history on file.  Family History  Problem Relation Age of Onset  . Diabetes Maternal Grandmother     Review of Systems  Objective:  There were no vitals taken for this visit.  Physical Exam No exam data present  Assessment and Plan :  Discussed healthy lifestyle, diet, exercise, preventative care, vaccinations, and addressed patient's concerns. Plan for follow up in ***. Otherwise, plan for specific conditions below.  There are no diagnoses linked to this encounter.  Benjiman CoreBrittany Holly Iannaccone PA-C  [facility] 08/04/2018 6:47 AM

## 2018-10-05 ENCOUNTER — Encounter (HOSPITAL_COMMUNITY): Payer: Self-pay | Admitting: Emergency Medicine

## 2018-10-05 ENCOUNTER — Emergency Department (HOSPITAL_COMMUNITY)
Admission: EM | Admit: 2018-10-05 | Discharge: 2018-10-05 | Disposition: A | Discharge status: Home / Self Care | Payer: Self-pay | Attending: Emergency Medicine | Admitting: Emergency Medicine

## 2018-10-05 ENCOUNTER — Emergency Department (HOSPITAL_COMMUNITY): Payer: Self-pay

## 2018-10-05 DIAGNOSIS — J4520 Mild intermittent asthma, uncomplicated: Secondary | ICD-10-CM | POA: Insufficient documentation

## 2018-10-05 DIAGNOSIS — Z79899 Other long term (current) drug therapy: Secondary | ICD-10-CM | POA: Insufficient documentation

## 2018-10-05 DIAGNOSIS — F17200 Nicotine dependence, unspecified, uncomplicated: Secondary | ICD-10-CM | POA: Insufficient documentation

## 2018-10-05 DIAGNOSIS — R05 Cough: Secondary | ICD-10-CM | POA: Diagnosis not present

## 2018-10-05 DIAGNOSIS — R0902 Hypoxemia: Secondary | ICD-10-CM | POA: Diagnosis not present

## 2018-10-05 MED ORDER — IPRATROPIUM-ALBUTEROL 0.5-2.5 (3) MG/3ML IN SOLN
3.0000 mL | Freq: Once | RESPIRATORY_TRACT | Status: AC
  Administered 2018-10-05: 3 mL via RESPIRATORY_TRACT
  Filled 2018-10-05: qty 3

## 2018-10-05 MED ORDER — PREDNISONE 20 MG PO TABS: ORAL_TABLET | ORAL | 0 refills | Status: DC

## 2018-10-05 MED ORDER — PREDNISONE 20 MG PO TABS
60.0000 mg | ORAL_TABLET | Freq: Once | ORAL | Status: AC
  Administered 2018-10-05: 60 mg via ORAL
  Filled 2018-10-05: qty 3

## 2018-10-05 MED ORDER — ALBUTEROL (5 MG/ML) CONTINUOUS INHALATION SOLN
10.0000 mg/h | INHALATION_SOLUTION | Freq: Once | RESPIRATORY_TRACT | Status: AC
  Administered 2018-10-05: 10 mg/h via RESPIRATORY_TRACT
  Filled 2018-10-05: qty 20

## 2018-10-05 MED ORDER — ALBUTEROL SULFATE HFA 108 (90 BASE) MCG/ACT IN AERS
2.0000 | INHALATION_SPRAY | RESPIRATORY_TRACT | Status: DC | PRN
  Administered 2018-10-05: 2 via RESPIRATORY_TRACT
  Filled 2018-10-05: qty 6.7

## 2018-10-05 MED ORDER — BENZONATATE 100 MG PO CAPS: 100.0000 mg | ORAL_CAPSULE | Freq: Three times a day (TID) | ORAL | 0 refills | Status: DC

## 2018-10-05 NOTE — ED Notes (Signed)
Bed: GN56 Expected date:  Expected time:  Means of arrival:  Comments: EVS

## 2018-10-05 NOTE — ED Notes (Signed)
Pt was seen at [facility] and sent over due to no improvement in wheezing with treatment. Concern since pt has been vaping which is concering since symptoms are increasing.

## 2018-10-05 NOTE — Discharge Instructions (Signed)
Your shortness of breath and wheezing is likely due to reactive airway disease.  Avoid vaping. Use albuterol inhaler 2 puffs every 4 hours as needed.  Take medications prescribed. Return if you have any concerns.

## 2018-10-05 NOTE — ED Triage Notes (Signed)
Patient here from [facility] walk in clinic with cough and wheezing for 10days. OTC meds with no relief. Received 1 albuterol treatment there. Recommended chest x-ray, steroids, and another albuterol.

## 2018-10-05 NOTE — ED Provider Notes (Signed)
[facility]-EMERGENCY DEPT Provider Note   CSN: [id number] Arrival date & time: 10/05/18  1426     History   Chief Complaint Chief Complaint  Patient presents with  . Cough    HPI Jasiri Hanawalt is a 23 y.o. male.  The history is provided by the patient. No language interpreter was used.  Cough      Generally healthy 23 year old male sent here from urgent care clinic for evaluation of cough and wheeze.  Patient report for the past week he has been having wheezing and coughing.  Symptom has been persistent, started after he started vaping.  He has quit vaping yet the symptoms still persist.  Symptom is moderate in severity, shortness of breath worsen with coughing.  No associated fever, chills, runny nose, sneezing, sore throat, ear pain, chest pain.  Went to urgent care, received 1 breathing treatment and was sent here for further management.  He did report improvement of his wheezing after initial breathing treatment.  He denies any prior history of asthma.  No prior history of PE DVT, no recent surgery, prolonged bedrest, active cancer or hemoptysis.  No recent travel.  Past Medical History:  Diagnosis Date  . ADD (attention deficit disorder)     Patient Active Problem List   Diagnosis Date Noted  . ADD (attention deficit disorder) 03/28/2015    History reviewed. No pertinent surgical history.      Home Medications    Prior to Admission medications   Medication Sig Start Date End Date Taking? Authorizing Provider  amphetamine-dextroamphetamine (ADDERALL) 20 MG tablet Take 1 tablet (20 mg total) by mouth 2 (two) times daily. For 30d after signed 06/02/18   Magdalene River, PA-C    Family History Family History  Problem Relation Age of Onset  . Diabetes Maternal Grandmother     Social History Social History   Tobacco Use  . Smoking status: Current Every Day Smoker    Packs/day: 0.25  . Smokeless tobacco: Never Used  Substance Use  Topics  . Alcohol use: No    Alcohol/week: 0.0 standard drinks  . Drug use: No     Allergies   Patient has no known allergies.   Review of Systems Review of Systems  Respiratory: Positive for cough.   All other systems reviewed and are negative.    Physical Exam Updated Vital Signs BP (!) 142/96 (BP Location: Right Arm)   Pulse 98   Temp 98.2 F (36.8 C) (Oral)   Resp 18   SpO2 95%   Physical Exam  Constitutional: He appears well-developed and well-nourished. No distress.  HENT:  Head: Atraumatic.  Right Ear: External ear normal.  Left Ear: External ear normal.  Mouth/Throat: Oropharynx is clear and moist.  Eyes: Conjunctivae are normal.  Neck: Normal range of motion. Neck supple. No JVD present.  Cardiovascular: Normal rate and regular rhythm.  Pulmonary/Chest: Effort normal. He has wheezes (Moderate inspiratory expiratory wheezes heard, without rales or rhonchi).  Abdominal: Soft. He exhibits no distension. There is no tenderness.  Musculoskeletal: He exhibits no edema.  Lymphadenopathy:    He has no cervical adenopathy.  Neurological: He is alert.  Skin: No rash noted.  Psychiatric: He has a normal mood and affect.  Nursing note and vitals reviewed.    ED Treatments / Results  Labs (all labs ordered are listed, but only abnormal results are displayed) Labs Reviewed - No data to display  EKG None  Radiology Dg Chest 2 View  Result Date: 10/05/2018 CLINICAL DATA:  Cough, current smoker EXAM: CHEST - 2 VIEW COMPARISON:  None. FINDINGS: Normal heart size. Normal mediastinal contour. No pneumothorax. No pleural effusion. Lungs appear clear, with no acute consolidative airspace disease and no pulmonary edema. IMPRESSION: No active cardiopulmonary disease. Electronically Signed   By: Delbert Phenix M.D.   On: 10/05/2018 15:28    Procedures Procedures (including critical care time)  Medications Ordered in ED Medications  albuterol (PROVENTIL,VENTOLIN)  solution continuous neb (has no administration in time range)  ipratropium-albuterol (DUONEB) 0.5-2.5 (3) MG/3ML nebulizer solution 3 mL (3 mLs Nebulization Given 10/05/18 1546)  predniSONE (DELTASONE) tablet 60 mg (60 mg Oral Given 10/05/18 1544)     Initial Impression / Assessment and Plan / ED Course  I have reviewed the triage vital signs and the nursing notes.  Pertinent labs & imaging results that were available during my care of the patient were reviewed by me and considered in my medical decision making (see chart for details).     BP (!) 142/96 (BP Location: Right Arm)   Pulse (!) 125   Temp 98.2 F (36.8 C) (Oral)   Resp 18   SpO2 96% Comment: pt ambulated around the department wo any difficulties breathing.    Final Clinical Impressions(s) / ED Diagnoses   Final diagnoses:  Mild intermittent reactive airway disease without complication    ED Discharge Orders         Ordered    predniSONE (DELTASONE) 20 MG tablet     10/05/18 1808    benzonatate (TESSALON) 100 MG capsule  Every 8 hours     10/05/18 1808         3:33 PM Patient with no known history of asthma, here with shortness of breath and wheezing for the past week after using vape..  No significant risk factor for PE.  Does have wheezing on exam but did report improvement with initial breathing treatment.  Will give steroid, continue with duo nebs and will monitor closely.  Patient otherwise well-appearing.  4:10 PM After the second dose of DuoNeb's, patient reported feeling better however on exam, moderate amount of wheezing were noted.  We will continue with albuterol continuous neb.  His chest x-ray is unremarkable.  6:09 PM Improvement of sxs after 3rd treatment. Wheezing improves.  Pt ambulate without hypoxia.  Will d/c home with steroid taper, tessalon, and albuterol inhaler. Recommend avoid vaping.  Return precaution given.    Fayrene Helper, PA-C 10/05/18 1810    Sabas Sous, MD 10/05/18  2226

## 2018-10-29 ENCOUNTER — Emergency Department (HOSPITAL_COMMUNITY)
Admission: EM | Admit: 2018-10-29 | Discharge: 2018-10-29 | Disposition: A | Discharge status: Home / Self Care | Payer: BLUE CROSS/BLUE SHIELD | Attending: Emergency Medicine | Admitting: Emergency Medicine

## 2018-10-29 ENCOUNTER — Other Ambulatory Visit: Payer: Self-pay

## 2018-10-29 ENCOUNTER — Encounter (HOSPITAL_COMMUNITY): Payer: Self-pay

## 2018-10-29 ENCOUNTER — Emergency Department (HOSPITAL_COMMUNITY): Payer: BLUE CROSS/BLUE SHIELD

## 2018-10-29 DIAGNOSIS — F17210 Nicotine dependence, cigarettes, uncomplicated: Secondary | ICD-10-CM | POA: Insufficient documentation

## 2018-10-29 DIAGNOSIS — R059 Cough, unspecified: Secondary | ICD-10-CM

## 2018-10-29 DIAGNOSIS — R062 Wheezing: Secondary | ICD-10-CM | POA: Insufficient documentation

## 2018-10-29 DIAGNOSIS — R0602 Shortness of breath: Secondary | ICD-10-CM | POA: Diagnosis not present

## 2018-10-29 DIAGNOSIS — R05 Cough: Secondary | ICD-10-CM | POA: Diagnosis not present

## 2018-10-29 MED ORDER — ALBUTEROL SULFATE (2.5 MG/3ML) 0.083% IN NEBU
5.0000 mg | INHALATION_SOLUTION | Freq: Once | RESPIRATORY_TRACT | Status: AC
  Administered 2018-10-29: 5 mg via RESPIRATORY_TRACT
  Filled 2018-10-29: qty 6

## 2018-10-29 MED ORDER — ALBUTEROL SULFATE HFA 108 (90 BASE) MCG/ACT IN AERS
2.0000 | INHALATION_SPRAY | Freq: Once | RESPIRATORY_TRACT | Status: AC
  Administered 2018-10-29: 2 via RESPIRATORY_TRACT
  Filled 2018-10-29: qty 6.7

## 2018-10-29 MED ORDER — PREDNISONE 10 MG PO TABS: 60.0000 mg | ORAL_TABLET | Freq: Every day | ORAL | 0 refills | Status: AC

## 2018-10-29 MED ORDER — AEROCHAMBER PLUS FLO-VU MEDIUM MISC
1.0000 | Freq: Once | Status: AC
  Administered 2018-10-29: 1
  Filled 2018-10-29: qty 1

## 2018-10-29 MED ORDER — PREDNISONE 20 MG PO TABS
60.0000 mg | ORAL_TABLET | Freq: Once | ORAL | Status: AC
  Administered 2018-10-29: 60 mg via ORAL
  Filled 2018-10-29: qty 3

## 2018-10-29 NOTE — ED Triage Notes (Signed)
Pt stated he was seen approximately a month ago with same complaint. Pt states that he was short of breath , and was given inhaler and steroids. Pt states he has had relief with the inhaler, but ran out. Pt states he is short of breath mostly in the middle of the night

## 2018-10-29 NOTE — ED Notes (Signed)
Ambulated patient in the hallway to monitor pulse oxi. Patients oxygen saturation only dropped from 100% to 97%. Patient continued to talk during ambulation with no complaints or shortness of breath.

## 2018-10-29 NOTE — ED Notes (Signed)
Pt states improvement after neb treatment.

## 2018-10-29 NOTE — ED Provider Notes (Signed)
[facility]-EMERGENCY DEPT Provider Note   CSN: [id number] Arrival date & time: 10/29/18  1714     History   Chief Complaint Chief Complaint  Patient presents with  . Shortness of Breath    HPI Mitchell Nelson is a 23 y.o. male.  HPI   Patient is a 23 year old male with a history of ADHD who presents the emergency department today complaining of dry cough, wheezing and shortness of breath that has been present for the last several days.  He reports mild rhinorrhea and nasal congestion.  States that the cough is worse at night.  No fevers or chills.  No chest pain or pain with inspiration.  No lower extremity swelling or pain. Denies hemoptysis, recent surgery/trauma, recent long travel, hormone use, personal hx of cancer, or hx of DVT/PE.  States that he does not smoke tobacco or vape.  States that he quit doing both of those things about 2 weeks ago.  States that he has no history of asthma.  He was seen in the ED about 1 month ago with similar symptoms.  He was given nebulizer treatments, steroids and an albuterol inhaler and his symptoms improved.  He was discharged with a course of steroids which he states improved his symptoms.  Past Medical History:  Diagnosis Date  . ADD (attention deficit disorder)     Patient Active Problem List   Diagnosis Date Noted  . ADD (attention deficit disorder) 03/28/2015    History reviewed. No pertinent surgical history.      Home Medications    Prior to Admission medications   Medication Sig Start Date End Date Taking? Authorizing Provider  amphetamine-dextroamphetamine (ADDERALL) 20 MG tablet Take 1 tablet (20 mg total) by mouth 2 (two) times daily. For 30d after signed 06/02/18   Magdalene River, PA-C  benzonatate (TESSALON) 100 MG capsule Take 1 capsule (100 mg total) by mouth every 8 (eight) hours. 10/05/18   Fayrene Helper, PA-C  predniSONE (DELTASONE) 10 MG tablet Take 6 tablets (60 mg total) by mouth daily  for 5 days. 10/29/18 11/03/18  Amron Guerrette S, PA-C    Family History Family History  Problem Relation Age of Onset  . Diabetes Maternal Grandmother     Social History Social History   Tobacco Use  . Smoking status: Current Every Day Smoker    Packs/day: 0.25  . Smokeless tobacco: Never Used  Substance Use Topics  . Alcohol use: No    Alcohol/week: 0.0 standard drinks  . Drug use: No     Allergies   Patient has no known allergies.   Review of Systems Review of Systems  Constitutional: Negative for chills and fever.  HENT: Positive for congestion and rhinorrhea. Negative for ear pain.   Eyes: Negative for visual disturbance.  Respiratory: Positive for cough, shortness of breath and wheezing.   Cardiovascular: Negative for chest pain, palpitations and leg swelling.  Gastrointestinal: Negative for abdominal pain, constipation, diarrhea, nausea and vomiting.  Genitourinary: Negative for flank pain and hematuria.  Musculoskeletal: Negative for back pain.  Skin: Negative for rash.  Neurological: Negative for dizziness, light-headedness and headaches.  All other systems reviewed and are negative.   Physical Exam Updated Vital Signs BP (!) 137/94 (BP Location: Left Arm)   Pulse 70   Temp 98.1 F (36.7 C) (Oral)   Resp 18   Ht 5\' 9"  (1.753 m)   Wt 86.2 kg   SpO2 96%   BMI 28.06 kg/m   Physical  Exam  Constitutional: He appears well-developed and well-nourished.  HENT:  Head: Normocephalic and atraumatic.  Eyes: Conjunctivae are normal.  Neck: Neck supple.  Cardiovascular: Normal rate, regular rhythm and normal heart sounds.  Pulmonary/Chest: Effort normal. No respiratory distress.  No tachypnea.  Speaking in full sentences in no acute distress.  Wheezing noted more so in the lower lung fields bilaterally.  No crackles or rales.  Decreased lung sounds throughout.  Abdominal: Soft. There is no tenderness.  Musculoskeletal: He exhibits no edema.       Right  lower leg: Normal. He exhibits no tenderness and no edema.       Left lower leg: Normal. He exhibits no tenderness and no edema.  Neurological: He is alert.  Skin: Skin is warm and dry.  Psychiatric: He has a normal mood and affect.  Nursing note and vitals reviewed.   ED Treatments / Results  Labs (all labs ordered are listed, but only abnormal results are displayed) Labs Reviewed - No data to display  EKG None  Radiology Dg Chest 2 View  Result Date: 10/29/2018 CLINICAL DATA:  Cough EXAM: CHEST - 2 VIEW COMPARISON:  10/05/2018 FINDINGS: The heart size and mediastinal contours are within normal limits. Both lungs are clear. The visualized skeletal structures are unremarkable. IMPRESSION: No active cardiopulmonary disease. Electronically Signed   By: Deatra Robinson M.D.   On: 10/29/2018 19:12    Procedures Procedures (including critical care time)  Medications Ordered in ED Medications  albuterol (PROVENTIL) (2.5 MG/3ML) 0.083% nebulizer solution 5 mg (5 mg Nebulization Given 10/29/18 1726)  albuterol (PROVENTIL HFA;VENTOLIN HFA) 108 (90 Base) MCG/ACT inhaler 2 puff (2 puffs Inhalation Given 10/29/18 1848)  AEROCHAMBER PLUS FLO-VU MEDIUM MISC 1 each (1 each Other Given 10/29/18 1849)  predniSONE (DELTASONE) tablet 60 mg (60 mg Oral Given 10/29/18 1847)     Initial Impression / Assessment and Plan / ED Course  I have reviewed the triage vital signs and the nursing notes.  Pertinent labs & imaging results that were available during my care of the patient were reviewed by me and considered in my medical decision making (see chart for details).     Final Clinical Impressions(s) / ED Diagnoses   Final diagnoses:  Cough   Suspect that patient may have viral upper respiratory infection.  Chest x-ray negative for pneumonia or pneumothorax.  Patient ambulated in ED with O2 saturations maintained at 97% and above, no current signs of respiratory distress. breathing improved and at  baseline after steroids, nebulizer treatment and albuterol inhaler given in ED. Pt has been instructed to continue using prescribed medications and to speak with PCP about today's exacerbation.  He was advised to return to the ER for any new or worsening symptoms in the meantime.   ED Discharge Orders         Ordered    predniSONE (DELTASONE) 10 MG tablet  Daily     10/29/18 1924           Karrie Meres, PA-C 10/29/18 1926    Charlynne Pander, MD 10/29/18 2218

## 2018-10-29 NOTE — Discharge Instructions (Signed)
Please use 1 to 2 puffs of the inhaler every 4-6 hours as needed for shortness of breath and cough.  Please take the steroid as directed on your discharge paperwork.    Please follow up with your primary care provider within 5-7 days for re-evaluation of your symptoms. If you do not have a primary care provider, information for a healthcare clinic has been provided for you to make arrangements for follow up care. Please return to the emergency department for any new or worsening symptoms.

## 2018-11-15 ENCOUNTER — Other Ambulatory Visit: Payer: Self-pay

## 2018-11-15 ENCOUNTER — Emergency Department (HOSPITAL_COMMUNITY)
Admission: EM | Admit: 2018-11-15 | Discharge: 2018-11-15 | Disposition: A | Discharge status: Home / Self Care | Payer: BLUE CROSS/BLUE SHIELD | Attending: Emergency Medicine | Admitting: Emergency Medicine

## 2018-11-15 ENCOUNTER — Emergency Department (HOSPITAL_COMMUNITY): Payer: BLUE CROSS/BLUE SHIELD

## 2018-11-15 DIAGNOSIS — J80 Acute respiratory distress syndrome: Secondary | ICD-10-CM | POA: Diagnosis not present

## 2018-11-15 DIAGNOSIS — Z79899 Other long term (current) drug therapy: Secondary | ICD-10-CM | POA: Insufficient documentation

## 2018-11-15 DIAGNOSIS — F17210 Nicotine dependence, cigarettes, uncomplicated: Secondary | ICD-10-CM | POA: Diagnosis not present

## 2018-11-15 DIAGNOSIS — R069 Unspecified abnormalities of breathing: Secondary | ICD-10-CM | POA: Diagnosis not present

## 2018-11-15 DIAGNOSIS — F909 Attention-deficit hyperactivity disorder, unspecified type: Secondary | ICD-10-CM | POA: Diagnosis not present

## 2018-11-15 DIAGNOSIS — R062 Wheezing: Secondary | ICD-10-CM | POA: Diagnosis not present

## 2018-11-15 DIAGNOSIS — R0902 Hypoxemia: Secondary | ICD-10-CM | POA: Diagnosis not present

## 2018-11-15 DIAGNOSIS — R05 Cough: Secondary | ICD-10-CM | POA: Diagnosis not present

## 2018-11-15 DIAGNOSIS — R0602 Shortness of breath: Secondary | ICD-10-CM | POA: Diagnosis not present

## 2018-11-15 LAB — CBC WITH DIFFERENTIAL/PLATELET
ABS IMMATURE GRANULOCYTES: 0.05 10*3/uL (ref 0.00–0.07)
BASOS PCT: 1 %
Basophils Absolute: 0.1 10*3/uL (ref 0.0–0.1)
EOS ABS: 0.6 10*3/uL — AB (ref 0.0–0.5)
EOS PCT: 11 %
HCT: 44.4 % (ref 39.0–52.0)
Hemoglobin: 14.9 g/dL (ref 13.0–17.0)
IMMATURE GRANULOCYTES: 1 %
LYMPHS ABS: 1.6 10*3/uL (ref 0.7–4.0)
Lymphocytes Relative: 28 %
MCH: 30.7 pg (ref 26.0–34.0)
MCHC: 33.6 g/dL (ref 30.0–36.0)
MCV: 91.4 fL (ref 80.0–100.0)
MONOS PCT: 6 %
Monocytes Absolute: 0.4 10*3/uL (ref 0.1–1.0)
NRBC: 0 % (ref 0.0–0.2)
Neutro Abs: 3.1 10*3/uL (ref 1.7–7.7)
Neutrophils Relative %: 53 %
PLATELETS: 223 10*3/uL (ref 150–400)
RBC: 4.86 MIL/uL (ref 4.22–5.81)
RDW: 12.1 % (ref 11.5–15.5)
WBC: 5.8 10*3/uL (ref 4.0–10.5)

## 2018-11-15 LAB — BASIC METABOLIC PANEL
ANION GAP: 8 (ref 5–15)
BUN: 16 mg/dL (ref 6–20)
CALCIUM: 9 mg/dL (ref 8.9–10.3)
CO2: 23 mmol/L (ref 22–32)
Chloride: 108 mmol/L (ref 98–111)
Creatinine, Ser: 0.82 mg/dL (ref 0.61–1.24)
GFR calc Af Amer: >60 mL/min (ref >60)
Glucose, Bld: 118 mg/dL — ABNORMAL HIGH (ref 70–99)
POTASSIUM: 3.9 mmol/L (ref 3.5–5.1)
SODIUM: 139 mmol/L (ref 135–145)

## 2018-11-15 MED ORDER — IPRATROPIUM-ALBUTEROL 0.5-2.5 (3) MG/3ML IN SOLN
3.0000 mL | Freq: Once | RESPIRATORY_TRACT | Status: AC
  Administered 2018-11-15: 3 mL via RESPIRATORY_TRACT
  Filled 2018-11-15: qty 3

## 2018-11-15 MED ORDER — PREDNISONE 20 MG PO TABS: 40.0000 mg | ORAL_TABLET | Freq: Every day | ORAL | 0 refills | Status: DC

## 2018-11-15 MED ORDER — ALBUTEROL (5 MG/ML) CONTINUOUS INHALATION SOLN
10.0000 mg/h | INHALATION_SOLUTION | RESPIRATORY_TRACT | Status: AC
  Administered 2018-11-15: 10 mg/h via RESPIRATORY_TRACT
  Filled 2018-11-15: qty 20

## 2018-11-15 MED ORDER — ALBUTEROL SULFATE (2.5 MG/3ML) 0.083% IN NEBU
5.0000 mg | INHALATION_SOLUTION | Freq: Once | RESPIRATORY_TRACT | Status: AC
  Administered 2018-11-15: 5 mg via RESPIRATORY_TRACT
  Filled 2018-11-15: qty 6

## 2018-11-15 NOTE — ED Notes (Signed)
Bed: [id number] Expected date:  Expected time:  Means of arrival:  Comments: EMS 23 yo male resp distress

## 2018-11-15 NOTE — Discharge Instructions (Addendum)
Stop smoking and vaping.  Return to the ER for worsening shortness of breath, fever, or productive cough.

## 2018-11-15 NOTE — ED Provider Notes (Signed)
[facility]-EMERGENCY DEPT Provider Note   CSN: [id number] Arrival date & time: 11/15/18  0128     History   Chief Complaint Chief Complaint  Patient presents with  . Shortness of Breath    HPI Mitchell Nelson is a 23 y.o. male.  Patient presents to the emergency department with a chief complaint of shortness of breath.  Denies any history of asthma or COPD.  He does smoke and vapes.  He reports having wheezing for the past 2 to 3 weeks.  He has been seen previously for the same and was prescribed prednisone and given an inhaler.  He denies any improvement of his symptoms despite using these as directed.  Denies any fevers or chills.  Denies any productive cough.  Dates that he took 25 mg of Benadryl and 1/4 dose of NyQuil prior to arrival.  The history is provided by the patient. No language interpreter was used.    Past Medical History:  Diagnosis Date  . ADD (attention deficit disorder)     Patient Active Problem List   Diagnosis Date Noted  . ADD (attention deficit disorder) 03/28/2015    No past surgical history on file.      Home Medications    Prior to Admission medications   Medication Sig Start Date End Date Taking? Authorizing Provider  amphetamine-dextroamphetamine (ADDERALL) 20 MG tablet Take 1 tablet (20 mg total) by mouth 2 (two) times daily. For 30d after signed 06/02/18   Magdalene River, PA-C  benzonatate (TESSALON) 100 MG capsule Take 1 capsule (100 mg total) by mouth every 8 (eight) hours. 10/05/18   Fayrene Helper, PA-C    Family History Family History  Problem Relation Age of Onset  . Diabetes Maternal Grandmother     Social History Social History   Tobacco Use  . Smoking status: Current Every Day Smoker    Packs/day: 0.25  . Smokeless tobacco: Never Used  Substance Use Topics  . Alcohol use: No    Alcohol/week: 0.0 standard drinks  . Drug use: No     Allergies   Patient has no known allergies.   Review of  Systems Review of Systems  All other systems reviewed and are negative.    Physical Exam Updated Vital Signs BP 130/89 (BP Location: Right Arm)   Pulse 100   Temp 97.8 F (36.6 C) (Oral)   Resp 16   Ht 5\' 9"  (1.753 m)   Wt 86.2 kg   SpO2 96%   BMI 28.06 kg/m   Physical Exam  Constitutional: He is oriented to person, place, and time. He appears well-developed and well-nourished.  HENT:  Head: Normocephalic and atraumatic.  Eyes: Pupils are equal, round, and reactive to light. Conjunctivae and EOM are normal. Right eye exhibits no discharge. Left eye exhibits no discharge. No scleral icterus.  Neck: Normal range of motion. Neck supple. No JVD present.  Cardiovascular: Normal rate, regular rhythm and normal heart sounds. Exam reveals no gallop and no friction rub.  No murmur heard. Pulmonary/Chest: Effort normal. No respiratory distress. He has wheezes. He has no rales. He exhibits no tenderness.  Abdominal: Soft. He exhibits no distension and no mass. There is no tenderness. There is no rebound and no guarding.  Musculoskeletal: Normal range of motion. He exhibits no edema or tenderness.  Neurological: He is alert and oriented to person, place, and time.  Skin: Skin is warm and dry.  Psychiatric: He has a normal mood and affect. His behavior  is normal. Judgment and thought content normal.  Nursing note and vitals reviewed.    ED Treatments / Results  Labs (all labs ordered are listed, but only abnormal results are displayed) Labs Reviewed  CBC WITH DIFFERENTIAL/PLATELET - Abnormal; Notable for the following components:      Result Value   Eosinophils Absolute 0.6 (*)    All other components within normal limits  BASIC METABOLIC PANEL - Abnormal; Notable for the following components:   Glucose, Bld 118 (*)    All other components within normal limits    EKG None  Radiology Dg Chest 2 View  Result Date: 11/15/2018 CLINICAL DATA:  Cough, wheezing for several weeks  EXAM: CHEST - 2 VIEW COMPARISON:  None. FINDINGS: The heart size and mediastinal contours are within normal limits. Both lungs are clear. The visualized skeletal structures are unremarkable. IMPRESSION: No active cardiopulmonary disease. Electronically Signed   By: Elige KoHetal  Patel   On: 11/15/2018 02:25    Procedures Procedures (including critical care time)  Medications Ordered in ED Medications  ipratropium-albuterol (DUONEB) 0.5-2.5 (3) MG/3ML nebulizer solution 3 mL (has no administration in time range)  albuterol (PROVENTIL) (2.5 MG/3ML) 0.083% nebulizer solution 5 mg (5 mg Nebulization Given 11/15/18 0138)     Initial Impression / Assessment and Plan / ED Course  I have reviewed the triage vital signs and the nursing notes.  Pertinent labs & imaging results that were available during my care of the patient were reviewed by me and considered in my medical decision making (see chart for details).    Patient with diffuse wheezes.  Drowsy on exam.  States did not sleep last night at all, and took some NyQuil and Benadryl tonight.  He does awake to voice, but quickly falls back to sleep.  Cast who accompanies patient states that this is normal for him after taking NyQuil.  No improvement despite 2 breathing treatments already.  Will give continuous albuterol treatment.  Patient has received 125 mg Solu-Medrol by EMS.    O2 saturation 90% on RA.  Breathing has improved significantly with continuous albuterol treatment.    O2 sat now 98-99%.  Will discharge with steroid taper. PCP follow-up.  Final Clinical Impressions(s) / ED Diagnoses   Final diagnoses:  Wheezing    ED Discharge Orders    None       Roxy HorsemanBrowning, Dorri Ozturk, PA-C 11/15/18 0515    Mesner, Barbara CowerJason, MD 11/15/18 [id number]

## 2018-11-15 NOTE — ED Notes (Signed)
Patient ambulated with pulse ox on, saturation maintained of 98-100%

## 2018-11-15 NOTE — ED Triage Notes (Signed)
Pt coming from home has had cough and shortness of breath for several weeks and has been evaluated twice. Patient has had wheezing and inhaler wasn't working. Patient received 2 duonebs and 125 mg [facility] medrol [facility]

## 2018-11-15 NOTE — ED Notes (Signed)
Patient also took 25 mg of Benadryl and Nyquil prior to calling EMS. Patient drowsy

## 2018-11-25 ENCOUNTER — Institutional Professional Consult (permissible substitution): Payer: BLUE CROSS/BLUE SHIELD | Admitting: Pulmonary Disease

## 2018-12-02 ENCOUNTER — Ambulatory Visit (INDEPENDENT_AMBULATORY_CARE_PROVIDER_SITE_OTHER): Payer: BLUE CROSS/BLUE SHIELD | Admitting: Internal Medicine

## 2018-12-02 ENCOUNTER — Encounter: Payer: Self-pay | Admitting: Internal Medicine

## 2018-12-02 VITALS — BP 106/70 | HR 63 | Ht 67.5 in | Wt 195.0 lb

## 2018-12-02 DIAGNOSIS — J45991 Cough variant asthma: Secondary | ICD-10-CM | POA: Diagnosis not present

## 2018-12-02 MED ORDER — MOMETASONE FUROATE 100 MCG/ACT IN AERO: 2.0000 | INHALATION_SPRAY | Freq: Two times a day (BID) | RESPIRATORY_TRACT | 11 refills | Status: DC

## 2018-12-02 MED ORDER — MOMETASONE FUROATE 100 MCG/ACT IN AERO: 2.0000 | INHALATION_SPRAY | Freq: Two times a day (BID) | RESPIRATORY_TRACT | 0 refills | Status: DC

## 2018-12-02 NOTE — Progress Notes (Signed)
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 Pulmonary Consult    Self referral.  Pt states he had cough and wheezing for 5 wks- resolved when he took pred taper.  Dyspnea:  Not limited by breathing from desired activities  But very inactive / worse breathing in hot weather than cold  Cough: resolved now though still stuffy nose year round x years  Sleep: flat / 2 pillows  SABA use: not using    No obvious other patterns in day to day or daytime variability or assoc excess/ purulent sputum or mucus plugs or hemoptysis or cp or chest tightness, subjective wheeze or overt   hb symptoms.   Sleeping as above now  without nocturnal  or early am exacerbation  of respiratory  c/o's or need for noct saba. Also denies any obvious fluctuation of symptoms with weather or environmental changes or other aggravating or alleviating factors except as outlined above   No unusual exposure hx or h/o childhood pna/ asthma or knowledge of premature birth.  Current Allergies, Complete Past Medical History, Past Surgical History, Family History, and Social History were reviewed in [employer] record.  ROS  The following are not active complaints unless bolded Hoarseness, sore throat, dysphagia, dental  problems, itching, sneezing,  nasal congestion or discharge of excess mucus or purulent secretions, ear ache,   fever, chills, sweats, unintended wt loss or wt gain, classically pleuritic or exertional cp,  orthopnea pnd or arm/hand swelling  or leg swelling, presyncope, palpitations, abdominal pain, anorexia, nausea, vomiting, diarrhea  or change in bowel habits or change in bladder habits, change in stools or change in urine, dysuria, hematuria,  rash, arthralgias, visual complaints, headache, numbness, weakness or ataxia or problems with walking or coordination,  change in mood or  memory.           Past Medical History:  Diagnosis Date  . ADD (attention deficit disorder)     Outpatient Medications Prior to Visit  Medication Sig Dispense Refill  . amphetamine-dextroamphetamine (ADDERALL) 20 MG tablet Take 1 tablet (20 mg total) by mouth 2 (two) times daily. For 30d after signed 60 tablet 0  . benzonatate (TESSALON) 100 MG capsule Take 1 capsule (100 mg total) by mouth every 8 (eight) hours. 21 capsule 0  . predniSONE (DELTASONE) 20 MG tablet Take 2 tablets (40 mg total) by mouth daily. Take 40 mg by mouth daily for 3 days, then 20mg  by mouth daily for 3 days, then 10mg  daily for 3 days 12 tablet 0       Objective:     BP 106/70 (BP Location: Left Arm, Cuff Size: Normal)   Pulse 63   Ht 5' 7.5" (1.715 m)   Wt 195 lb (  88.5 kg)   SpO2 99%   BMI 30.09 kg/m   SpO2: 99 %  RA  Pleasant amb arabic male nad   HEENT: nl dentition, and oropharynx. Nl external ear canals without cough reflex - mod bilateral L > R non-specific turbinate edema     NECK :  without JVD/Nodes/TM/ nl carotid upstrokes bilaterally   LUNGS: no acc muscle use,  Nl contour chest which is clear to A and P bilaterally without cough on insp or exp maneuvers   CV:  RRR  no s3 or murmur or increase in P2, and no edema   ABD:  soft and nontender with nl inspiratory excursion in the supine position. No bruits or  organomegaly appreciated, bowel sounds nl  MS:  Nl gait/ ext warm without deformities, calf tenderness, cyanosis or clubbing No obvious joint restrictions   SKIN: warm and dry without lesions    NEURO:  alert, approp, nl sensorium with  no motor or cerebellar deficits apparent.      I personally reviewed images and agree with radiology impression as follows:  CXR:   11/15/18  No active cardiopulmonary disease.    Assessment   Cough variant asthma Aggravated by Vape exposure 09/2018 / steroid responsive - Spirometry 12/02/2018  FEV1 3.3 (89%)  Ratio 78 s curvature 2 d p completed pred taper  - 12/02/2018  After extensive coaching inhaler device,  effectiveness = 75% > try sample of asmanex x 4 weeks then rtc   Assoc with mod severe symptoms/ signs of chronic rhinitis suggesting underlying asthma provoked by vaping, not a "vape case" per se > since he's only 2 days off prednisone but so apparently responsive rec he "bridge" off it with asmanex 100 2bid x 4 weeks then regroup to taper at that point if remains symptom free and if not needs MCT to clarify dx as well as allergy testing.   Total time devoted to counseling  > 50 % of initial 60 min office visit:  review case with pt/ discussion of options/alternatives -   device teaching   extended face to face time for this visit -   personally creating written customized instructions  in presence of pt  then going over those specific  Instructions directly with the pt including how to use all of the meds but in particular covering each new medication in detail and the difference between the maintenance= "automatic" meds and the prns using an action plan format for the latter (If this problem/symptom => do that organization reading Left to right).  Please see AVS from this visit for a full list of these instructions which I personally wrote for this pt and  are unique to this visit.      Mitchell HughsMichael Clavin Ruhlman, MD 12/02/2018

## 2018-12-02 NOTE — Patient Instructions (Signed)
Plan A = Automatic = Asmanex 100 Take 2 puffs first thing in am and then another 2 puffs about 12 hours later.    Work on inhaler technique:  relax and gently blow all the way out then take a nice smooth deep breath back in, triggering the inhaler at same time you start breathing in.  Hold for up to 5 seconds if you can. Blow out thru nose. Rinse and gargle with water when done      Plan B = Backup Only use your albuterol inhaler as a rescue medication to be used if you can't catch your breath by resting or doing a relaxed purse lip breathing pattern.  - The less you use it, the better it will work when you need it. - Ok to use the inhaler up to 2 puffs  every 4 hours if you must but call for appointment if use goes up over your usual need - Don't leave home without it !!  (think of it like the spare tire for your car)    Please schedule a follow up office visit in 4 weeks, sooner if needed  with all medications /inhalers/ solutions in hand so we can verify exactly what you are taking. This includes all medications from all doctors and over the counters

## 2018-12-03 ENCOUNTER — Encounter: Payer: Self-pay | Admitting: Internal Medicine

## 2018-12-03 DIAGNOSIS — J45991 Cough variant asthma: Secondary | ICD-10-CM | POA: Insufficient documentation

## 2018-12-03 NOTE — Assessment & Plan Note (Addendum)
Aggravated by Vape exposure 09/2018 / steroid responsive - Spirometry 12/02/2018  FEV1 3.3 (89%)  Ratio 78 s curvature 2 d p completed pred taper  - 12/02/2018  After extensive coaching inhaler device,  effectiveness = 75% > try sample of asmanex x 4 weeks then rtc   Assoc with mod severe symptoms/ signs of chronic rhinitis suggesting underlying asthma provoked by vaping, not a "vape case" per se > since he's only 2 days off prednisone but so apparently responsive rec he "bridge" off it with asmanex 100 2bid x 4 weeks then regroup to taper at that point if remains symptom free and if not needs MCT to clarify dx as well as allergy testing.   Total time devoted to counseling  > 50 % of initial 60 min office visit:  review case with pt/ discussion of options/alternatives -   device teaching   extended face to face time for this visit -   personally creating written customized instructions  in presence of pt  then going over those specific  Instructions directly with the pt including how to use all of the meds but in particular covering each new medication in detail and the difference between the maintenance= "automatic" meds and the prns using an action plan format for the latter (If this problem/symptom => do that organization reading Left to right).  Please see AVS from this visit for a full list of these instructions which I personally wrote for this pt and  are unique to this visit.

## 2019-01-14 ENCOUNTER — Encounter (HOSPITAL_COMMUNITY): Payer: Self-pay

## 2019-01-14 ENCOUNTER — Encounter (HOSPITAL_COMMUNITY): Payer: Self-pay | Admitting: Emergency Medicine

## 2019-01-14 ENCOUNTER — Emergency Department (HOSPITAL_COMMUNITY)
Admission: EM | Admit: 2019-01-14 | Discharge: 2019-01-14 | Disposition: A | Discharge status: Home / Self Care | Payer: BLUE CROSS/BLUE SHIELD | Attending: Emergency Medicine | Admitting: Emergency Medicine

## 2019-01-14 ENCOUNTER — Other Ambulatory Visit: Payer: Self-pay

## 2019-01-14 ENCOUNTER — Telehealth: Payer: Self-pay | Admitting: Internal Medicine

## 2019-01-14 DIAGNOSIS — J45901 Unspecified asthma with (acute) exacerbation: Secondary | ICD-10-CM

## 2019-01-14 DIAGNOSIS — Z87891 Personal history of nicotine dependence: Secondary | ICD-10-CM | POA: Insufficient documentation

## 2019-01-14 MED ORDER — ALBUTEROL SULFATE HFA 108 (90 BASE) MCG/ACT IN AERS: 2.0000 | INHALATION_SPRAY | RESPIRATORY_TRACT | Status: DC

## 2019-01-14 MED ORDER — ALBUTEROL SULFATE HFA 108 (90 BASE) MCG/ACT IN AERS
2.0000 | INHALATION_SPRAY | RESPIRATORY_TRACT | Status: DC
  Administered 2019-01-14: 2 via RESPIRATORY_TRACT
  Filled 2019-01-14: qty 6.7

## 2019-01-14 MED ORDER — ALBUTEROL SULFATE (2.5 MG/3ML) 0.083% IN NEBU
5.0000 mg | INHALATION_SOLUTION | Freq: Once | RESPIRATORY_TRACT | Status: AC
  Administered 2019-01-14: 5 mg via RESPIRATORY_TRACT
  Filled 2019-01-14: qty 6

## 2019-01-14 NOTE — Telephone Encounter (Signed)
States went to ED this am for this and they gave him steroids but she is worried about him being on this for a long period of time.

## 2019-01-14 NOTE — Telephone Encounter (Signed)
Called patients sister at number provided x2. Unable to reach as phone rang once each time then went to the busy signal. Called patients phone number and unable to reach. Left message to give [place] a call back.

## 2019-01-14 NOTE — ED Triage Notes (Signed)
Patient c/o expiratory wheezing since last night. Patient states he ran out of his albuterol inhaler. Patient has a productive cough with clear sputum.

## 2019-01-14 NOTE — ED Provider Notes (Signed)
[facility]-EMERGENCY DEPT Provider Note   CSN: [id number] Arrival date & time: 01/14/19  [id number]     History   Chief Complaint Chief Complaint  Patient presents with  . Wheezing    HPI Mitchell Nelson is a 24 y.o. male.  24 year old male presents with wheezing or shortness of breath.  Has a history of asthma and states that he ran out of his inhaler.  Cough is been nonproductive without fever chills.  Denies any chest discomfort.  States that he usually gets this way when the weather changes.     Past Medical History:  Diagnosis Date  . ADD (attention deficit disorder)     Patient Active Problem List   Diagnosis Date Noted  . Cough variant asthma 12/03/2018  . ADD (attention deficit disorder) 03/28/2015    History reviewed. No pertinent surgical history.      Home Medications    Prior to Admission medications   Medication Sig Start Date End Date Taking? Authorizing Provider  Mometasone Furoate [facility](ASMANEX HFA) 100 MCG/ACT AERO Inhale 2 puffs into the lungs 2 (two) times daily. Patient not taking: Reported on 01/14/2019 12/02/18   Nyoka CowdenWert, Michael B, MD    Family History Family History  Problem Relation Age of Onset  . Diabetes Maternal Grandmother     Social History Social History   Tobacco Use  . Smoking status: Former Smoker    Packs/day: 0.25    Years: 6.00    Pack years: 1.50    Last attempt to quit: 08/31/2018    Years since quitting: 0.3  . Smokeless tobacco: Never Used  Substance Use Topics  . Alcohol use: No    Alcohol/week: 0.0 standard drinks  . Drug use: No     Allergies   Patient has no known allergies.   Review of Systems Review of Systems  All other systems reviewed and are negative.    Physical Exam Updated Vital Signs BP 134/89 (BP Location: Left Arm)   Pulse (!) 122   Temp 98.6 F (37 C) (Oral)   Resp 20   Ht 1.753 m (5\' 9" )   Wt 86.2 kg   SpO2 99% Comment: Sats 99% after neb treatment.  BMI 28.06  kg/m   Physical Exam Vitals signs and nursing note reviewed.  Constitutional:      General: He is not in acute distress.    Appearance: Normal appearance. He is well-developed. He is not toxic-appearing.  HENT:     Head: Normocephalic and atraumatic.  Eyes:     General: Lids are normal.     Conjunctiva/sclera: Conjunctivae normal.     Pupils: Pupils are equal, round, and reactive to light.  Neck:     Musculoskeletal: Normal range of motion and neck supple.     Thyroid: No thyroid mass.     Trachea: No tracheal deviation.  Cardiovascular:     Rate and Rhythm: Normal rate and regular rhythm.     Heart sounds: Normal heart sounds. No murmur. No gallop.   Pulmonary:     Effort: Pulmonary effort is normal. No respiratory distress.     Breath sounds: No stridor. Examination of the right-upper field reveals decreased breath sounds. Examination of the left-upper field reveals decreased breath sounds. Decreased breath sounds present. No wheezing, rhonchi or rales.  Abdominal:     General: Bowel sounds are normal. There is no distension.     Palpations: Abdomen is soft.     Tenderness: There is no abdominal  tenderness. There is no rebound.  Musculoskeletal: Normal range of motion.        General: No tenderness.  Skin:    General: Skin is warm and dry.     Findings: No abrasion or rash.  Neurological:     Mental Status: He is alert and oriented to person, place, and time.     GCS: GCS eye subscore is 4. GCS verbal subscore is 5. GCS motor subscore is 6.     Cranial Nerves: No cranial nerve deficit.     Sensory: No sensory deficit.  Psychiatric:        Speech: Speech normal.        Behavior: Behavior normal.      ED Treatments / Results  Labs (all labs ordered are listed, but only abnormal results are displayed) Labs Reviewed - No data to display  EKG None  Radiology No results found.  Procedures Procedures (including critical care time)  Medications Ordered in  ED Medications  albuterol (PROVENTIL) (2.5 MG/3ML) 0.083% nebulizer solution 5 mg (5 mg Nebulization Given 01/14/19 0733)     Initial Impression / Assessment and Plan / ED Course  I have reviewed the triage vital signs and the nursing notes.  Pertinent labs & imaging results that were available during my care of the patient were reviewed by me and considered in my medical decision making (see chart for details).     Patient had wheezing on exam he was given albuterol and feels much better.  Will prescribe albuterol.  Final Clinical Impressions(s) / ED Diagnoses   Final diagnoses:  None    ED Discharge Orders    None       Lorre NickAllen, Javonda Suh, MD 01/14/19 [phone]

## 2019-01-15 NOTE — Telephone Encounter (Signed)
Attempted to contact pt's sister, Saher. The line would ring then go to a busy tone. Will try back.  

## 2019-01-16 NOTE — Telephone Encounter (Signed)
Attempted to contact pt's sister, Orthoptist. The line would ring then go to a busy tone. Will try back.

## 2019-01-18 DIAGNOSIS — R062 Wheezing: Secondary | ICD-10-CM | POA: Diagnosis not present

## 2019-01-18 DIAGNOSIS — R05 Cough: Secondary | ICD-10-CM | POA: Diagnosis not present

## 2019-01-18 DIAGNOSIS — R0602 Shortness of breath: Secondary | ICD-10-CM | POA: Diagnosis not present

## 2019-01-19 NOTE — Telephone Encounter (Signed)
We have attempted to contact pt's sister several times with no success or call back from her. Per triage protocol, message will be closed.

## 2019-02-15 ENCOUNTER — Emergency Department (HOSPITAL_COMMUNITY): Payer: BLUE CROSS/BLUE SHIELD

## 2019-02-15 ENCOUNTER — Encounter (HOSPITAL_COMMUNITY): Payer: Self-pay | Admitting: Emergency Medicine

## 2019-02-15 ENCOUNTER — Emergency Department (HOSPITAL_COMMUNITY)
Admission: EM | Admit: 2019-02-15 | Discharge: 2019-02-15 | Disposition: A | Discharge status: Home / Self Care | Payer: BLUE CROSS/BLUE SHIELD | Attending: Emergency Medicine | Admitting: Emergency Medicine

## 2019-02-15 ENCOUNTER — Other Ambulatory Visit: Payer: Self-pay

## 2019-02-15 DIAGNOSIS — J4551 Severe persistent asthma with (acute) exacerbation: Secondary | ICD-10-CM | POA: Insufficient documentation

## 2019-02-15 DIAGNOSIS — J45901 Unspecified asthma with (acute) exacerbation: Secondary | ICD-10-CM

## 2019-02-15 DIAGNOSIS — R0602 Shortness of breath: Secondary | ICD-10-CM | POA: Diagnosis not present

## 2019-02-15 DIAGNOSIS — R05 Cough: Secondary | ICD-10-CM | POA: Diagnosis not present

## 2019-02-15 HISTORY — DX: Unspecified asthma, uncomplicated: J45.909

## 2019-02-15 MED ORDER — ALBUTEROL SULFATE (2.5 MG/3ML) 0.083% IN NEBU
5.0000 mg | INHALATION_SOLUTION | Freq: Once | RESPIRATORY_TRACT | Status: AC
  Administered 2019-02-15: 5 mg via RESPIRATORY_TRACT
  Filled 2019-02-15: qty 6

## 2019-02-15 MED ORDER — ALBUTEROL (5 MG/ML) CONTINUOUS INHALATION SOLN
10.0000 mg/h | INHALATION_SOLUTION | RESPIRATORY_TRACT | Status: DC
  Administered 2019-02-15: 10 mg/h via RESPIRATORY_TRACT

## 2019-02-15 MED ORDER — ALBUTEROL SULFATE HFA 108 (90 BASE) MCG/ACT IN AERS
2.0000 | INHALATION_SPRAY | Freq: Once | RESPIRATORY_TRACT | Status: AC
  Administered 2019-02-15: 2 via RESPIRATORY_TRACT
  Filled 2019-02-15: qty 6.7

## 2019-02-15 MED ORDER — ALBUTEROL (5 MG/ML) CONTINUOUS INHALATION SOLN
5.0000 mg/h | INHALATION_SOLUTION | Freq: Once | RESPIRATORY_TRACT | Status: DC
  Filled 2019-02-15: qty 20

## 2019-02-15 MED ORDER — PREDNISONE 20 MG PO TABS
60.0000 mg | ORAL_TABLET | Freq: Once | ORAL | Status: AC
  Administered 2019-02-15: 60 mg via ORAL
  Filled 2019-02-15: qty 3

## 2019-02-15 MED ORDER — ALBUTEROL SULFATE HFA 108 (90 BASE) MCG/ACT IN AERS: 1.0000 | INHALATION_SPRAY | Freq: Four times a day (QID) | RESPIRATORY_TRACT | 0 refills | Status: DC | PRN

## 2019-02-15 MED ORDER — PREDNISONE 10 MG PO TABS: ORAL_TABLET | ORAL | 0 refills | Status: DC

## 2019-02-15 NOTE — Discharge Instructions (Signed)
Return if any problems.

## 2019-02-15 NOTE — ED Provider Notes (Signed)
Mitchell Nelson Provider Note   CSN: [id number] Arrival date & time: 02/15/19  1432     History   Chief Complaint Chief Complaint  Patient presents with  . Asthma  . Shortness of Breath    [facility] is a 24 y.o. male.  The history is provided by the patient. No language interpreter was used.  Asthma  This is a new problem. The current episode started 12 to 24 hours ago. The problem occurs constantly. The problem has been gradually worsening. Associated symptoms include shortness of breath. Nothing aggravates the symptoms. Nothing relieves the symptoms. He has tried nothing for the symptoms. The treatment provided no relief.  Shortness of Breath  Associated symptoms: cough    Pt complains of asthma attack.  Pt reports he is out of albuterol  Past Medical History:  Diagnosis Date  . ADD (attention deficit disorder)   . Asthma     Patient Active Problem List   Diagnosis Date Noted  . Cough variant asthma 12/03/2018  . ADD (attention deficit disorder) 03/28/2015    History reviewed. No pertinent surgical history.      Home Medications    Prior to Admission medications   Medication Sig Start Date End Date Taking? Authorizing Provider  albuterol (PROVENTIL HFA;VENTOLIN HFA) 108 (90 Base) MCG/ACT inhaler Inhale 1-2 puffs into the lungs every 6 (six) hours as needed for wheezing or shortness of breath. 02/15/19   Elson AreasSofia, Nomi Rudnicki K, PA-C  cephALEXin (KEFLEX) 500 MG capsule Take 1 capsule (500 mg total) by mouth 3 (three) times daily. 10/12/13   Reuben LikesKeller, David C, MD  HYDROcodone-acetaminophen (NORCO/VICODIN) 5-325 MG per tablet 1 to 2 tabs every 4 to 6 hours as needed for pain. 10/12/13   Reuben LikesKeller, David C, MD  Mometasone Furoate [facility](ASMANEX HFA) 100 MCG/ACT AERO Inhale 2 puffs into the lungs 2 (two) times daily. Patient not taking: Reported on 01/14/2019 12/02/18   Nyoka CowdenWert, Michael B, MD  predniSONE (DELTASONE) 10 MG tablet 6,5,4,3,2,1  taper 02/15/19   Elson AreasSofia, Nasier Thumm K, PA-C    Family History Family History  Problem Relation Age of Onset  . Diabetes Maternal Grandmother     Social History Social History   Tobacco Use  . Smoking status: Former Smoker    Packs/day: 0.25    Years: 6.00    Pack years: 1.50    Last attempt to quit: 08/31/2018    Years since quitting: 0.4  . Smokeless tobacco: Never Used  Substance Use Topics  . Alcohol use: No    Alcohol/week: 0.0 standard drinks  . Drug use: No     Allergies   Patient has no known allergies.   Review of Systems Review of Systems  Respiratory: Positive for cough and shortness of breath.   All other systems reviewed and are negative.    Physical Exam Updated Vital Signs BP 128/86 (BP Location: Right Arm)   Pulse (!) 114   Temp 98.4 F (36.9 C) (Oral)   Resp 20   Ht 5\' 9"  (1.753 m)   Wt 88.5 kg   SpO2 100%   BMI 28.80 kg/m   Physical Exam Vitals signs and nursing note reviewed.  Constitutional:      Appearance: He is well-developed.  HENT:     Mouth/Throat:     Mouth: Mucous membranes are moist.  Eyes:     Pupils: Pupils are equal, round, and reactive to light.  Neck:     Musculoskeletal: Normal range of  motion.  Cardiovascular:     Rate and Rhythm: Normal rate.  Pulmonary:     Effort: Pulmonary effort is normal.     Breath sounds: Examination of the right-upper field reveals wheezing. Examination of the left-upper field reveals wheezing. Examination of the right-middle field reveals wheezing. Examination of the left-middle field reveals wheezing. Examination of the right-lower field reveals wheezing. Examination of the left-lower field reveals wheezing. Wheezing present.  Musculoskeletal: Normal range of motion.  Skin:    General: Skin is warm.  Neurological:     General: No focal deficit present.     Mental Status: He is alert.  Psychiatric:        Mood and Affect: Mood normal.      ED Treatments / Results  Labs (all labs  ordered are listed, but only abnormal results are displayed) Labs Reviewed - No data to display  EKG None  Radiology Dg Chest 2 View  Result Date: 02/15/2019 CLINICAL DATA:  Shortness of breath. History of asthma. Post breathing treatment. EXAM: CHEST - 2 VIEW COMPARISON:  None. FINDINGS: Heart size and mediastinal contours are within normal limits. Lungs are clear. Lung volumes are normal. No pleural effusion or pneumothorax seen. Osseous structures are unremarkable. IMPRESSION: No active cardiopulmonary disease. No evidence of pneumonia or pulmonary edema. Electronically Signed   By: Bary Richard M.D.   On: 02/15/2019 15:30    Procedures Procedures (including critical care time)  Medications Ordered in ED Medications  albuterol (PROVENTIL,VENTOLIN) solution continuous neb (10 mg/hr Nebulization New Bag/Given 02/15/19 1629)  albuterol (PROVENTIL HFA;VENTOLIN HFA) 108 (90 Base) MCG/ACT inhaler 2 puff (has no administration in time range)  albuterol (PROVENTIL) (2.5 MG/3ML) 0.083% nebulizer solution 5 mg (5 mg Nebulization Given 02/15/19 1449)  predniSONE (DELTASONE) tablet 60 mg (60 mg Oral Given 02/15/19 1518)     Initial Impression / Assessment and Plan / ED Course  I have reviewed the triage vital signs and the nursing notes.  Pertinent labs & imaging results that were available during my care of the patient were reviewed by me and considered in my medical decision making (see chart for details).     MDM  Pt given DuoNeb by triage nurse.  Patient continues to have wheezing.  Patient continues to complain of shortness of breath.  Given a 5 mg albuterol neb on reexamination he continues to have wheezing.  Patient is given 60 mg of prednisone p.o.  Patient is started on a 1 hour continuous neb treatment.  After 1 hour treatment patient's O2 sats are 98% he reports he feels much better.  Lung exam patient's lungs are clear except for rhonchi.  Patient is given a prescription for  prednisone taper and an albuterol inhaler he is given an albuterol inhaler here as he is currently out of albuterol.  Is scheduled to see his pulmonologist tomorrow.  Advised to return to the emergency department if symptoms worsen or change  Final Clinical Impressions(s) / ED Diagnoses   Final diagnoses:  Severe asthma with exacerbation, unspecified whether persistent    ED Discharge Orders         Ordered    predniSONE (DELTASONE) 10 MG tablet     02/15/19 1729    albuterol (PROVENTIL HFA;VENTOLIN HFA) 108 (90 Base) MCG/ACT inhaler  Every 6 hours PRN     02/15/19 1729        An After Visit Summary was printed and given to the patient.    Elson Areas, [place] 02/15/19  1737    Sabas Sous, MD 02/18/19 1729

## 2019-02-15 NOTE — ED Notes (Signed)
O2 2 L via [place] initiated for O2 sat of 92%

## 2019-02-15 NOTE — ED Triage Notes (Signed)
Pt with shortness of breath and wheezing that started today. Pt has inhalers that he has used without relief. Neb tx started by triage nurse. Wheezing present throughout. Pt c/o cough.

## 2019-05-02 ENCOUNTER — Emergency Department (HOSPITAL_COMMUNITY)
Admission: EM | Admit: 2019-05-02 | Discharge: 2019-05-02 | Disposition: A | Discharge status: Home / Self Care | Payer: BLUE CROSS/BLUE SHIELD | Attending: Emergency Medicine | Admitting: Emergency Medicine

## 2019-05-02 ENCOUNTER — Other Ambulatory Visit: Payer: Self-pay

## 2019-05-02 DIAGNOSIS — R062 Wheezing: Secondary | ICD-10-CM | POA: Diagnosis not present

## 2019-05-02 DIAGNOSIS — J45901 Unspecified asthma with (acute) exacerbation: Secondary | ICD-10-CM | POA: Diagnosis not present

## 2019-05-02 DIAGNOSIS — Z87891 Personal history of nicotine dependence: Secondary | ICD-10-CM | POA: Insufficient documentation

## 2019-05-02 DIAGNOSIS — R000 Tachycardia, unspecified: Secondary | ICD-10-CM | POA: Diagnosis not present

## 2019-05-02 DIAGNOSIS — J80 Acute respiratory distress syndrome: Secondary | ICD-10-CM | POA: Diagnosis not present

## 2019-05-02 DIAGNOSIS — I499 Cardiac arrhythmia, unspecified: Secondary | ICD-10-CM | POA: Diagnosis not present

## 2019-05-02 MED ORDER — ALBUTEROL SULFATE HFA 108 (90 BASE) MCG/ACT IN AERS
8.0000 | INHALATION_SPRAY | Freq: Once | RESPIRATORY_TRACT | Status: AC
  Administered 2019-05-02: 06:00:00 8 via RESPIRATORY_TRACT

## 2019-05-02 MED ORDER — ALBUTEROL SULFATE HFA 108 (90 BASE) MCG/ACT IN AERS: 2.0000 | INHALATION_SPRAY | RESPIRATORY_TRACT | 3 refills | Status: DC | PRN

## 2019-05-02 MED ORDER — PREDNISONE 20 MG PO TABS: 40.0000 mg | ORAL_TABLET | Freq: Every day | ORAL | 0 refills | Status: DC

## 2019-05-02 MED ORDER — ALBUTEROL SULFATE HFA 108 (90 BASE) MCG/ACT IN AERS
8.0000 | INHALATION_SPRAY | Freq: Once | RESPIRATORY_TRACT | Status: AC
  Administered 2019-05-02: 8 via RESPIRATORY_TRACT
  Filled 2019-05-02: qty 6.7

## 2019-05-02 NOTE — ED Notes (Signed)
Ambulated patient with pulse ox; sats were 90-93% while ambulating. Pt states that he feels better.

## 2019-05-02 NOTE — ED Triage Notes (Signed)
Patient's has hx of asthma and inhaler is out. Per EMS when approaching pt they could hear wheezing. EMS gave 125 solumedrol 10 albuterol, .5 atrovent, Magnesium 2 grams with some relief.

## 2019-05-02 NOTE — ED Provider Notes (Signed)
[facility] EMERGENCY DEPARTMENT Provider Note   CSN: [id number] Arrival date & time: 05/02/19  0429    History   Chief Complaint Chief Complaint  Patient presents with  . Asthma    HPI Mitchell Nelson is a 24 y.o. male.     Patient presents to the emergency department with a chief complaint of asthma exacerbation.  He has a history of asthma.  States that he has run out of his inhaler.  Reports worsening of symptoms over the past couple of days.  He denies any fevers, chills, or productive cough.  Denies any known exposures to any persons with COVID-19.  He has been social isolating at home.  Denies any other associated symptoms.  Was given Solu-Medrol, magnesium, and albuterol in route.  The history is provided by the patient. No language interpreter was used.    Past Medical History:  Diagnosis Date  . ADD (attention deficit disorder)   . Asthma     Patient Active Problem List   Diagnosis Date Noted  . Cough variant asthma 12/03/2018  . ADD (attention deficit disorder) 03/28/2015    No past surgical history on file.      Home Medications    Prior to Admission medications   Medication Sig Start Date End Date Taking? Authorizing Provider  albuterol (PROVENTIL HFA;VENTOLIN HFA) 108 (90 Base) MCG/ACT inhaler Inhale 1-2 puffs into the lungs every 6 (six) hours as needed for wheezing or shortness of breath. 02/15/19   Elson Areas, PA-C  cephALEXin (KEFLEX) 500 MG capsule Take 1 capsule (500 mg total) by mouth 3 (three) times daily. 10/12/13   Reuben Likes, MD  HYDROcodone-acetaminophen (NORCO/VICODIN) 5-325 MG per tablet 1 to 2 tabs every 4 to 6 hours as needed for pain. 10/12/13   Reuben Likes, MD  Mometasone Furoate [facility] HFA) 100 MCG/ACT AERO Inhale 2 puffs into the lungs 2 (two) times daily. Patient not taking: Reported on 01/14/2019 12/02/18   Nyoka Cowden, MD  predniSONE (DELTASONE) 10 MG tablet 6,5,4,3,2,1 taper 02/15/19    Elson Areas, PA-C    Family History Family History  Problem Relation Age of Onset  . Diabetes Maternal Grandmother     Social History Social History   Tobacco Use  . Smoking status: Former Smoker    Packs/day: 0.25    Years: 6.00    Pack years: 1.50    Last attempt to quit: 08/31/2018    Years since quitting: 0.6  . Smokeless tobacco: Never Used  Substance Use Topics  . Alcohol use: No    Alcohol/week: 0.0 standard drinks  . Drug use: No     Allergies   Patient has no known allergies.   Review of Systems Review of Systems  All other systems reviewed and are negative.    Physical Exam Updated Vital Signs BP 132/79   Pulse 95   Temp 99.1 F (37.3 C) (Oral)   Resp 20   Ht 5\' 9"  (1.753 m)   Wt 90.7 kg   SpO2 90%   BMI 29.53 kg/m   Physical Exam Vitals signs and nursing note reviewed.  Constitutional:      Appearance: He is well-developed.  HENT:     Head: Normocephalic and atraumatic.  Eyes:     Conjunctiva/sclera: Conjunctivae normal.  Neck:     Musculoskeletal: Neck supple.  Cardiovascular:     Rate and Rhythm: Normal rate and regular rhythm.     Heart sounds: No  murmur.  Pulmonary:     Effort: Pulmonary effort is normal. No respiratory distress.     Breath sounds: Wheezing present.  Abdominal:     Palpations: Abdomen is soft.     Tenderness: There is no abdominal tenderness.  Musculoskeletal: Normal range of motion.  Skin:    General: Skin is warm and dry.  Neurological:     Mental Status: He is alert.  Psychiatric:        Mood and Affect: Mood normal.        Behavior: Behavior normal.        Thought Content: Thought content normal.        Judgment: Judgment normal.      ED Treatments / Results  Labs (all labs ordered are listed, but only abnormal results are displayed) Labs Reviewed - No data to display  EKG None  Radiology No results found.  Procedures Procedures (including critical care time)  Medications Ordered in  ED Medications  albuterol (VENTOLIN HFA) 108 (90 Base) MCG/ACT inhaler 8 puff (has no administration in time range)  albuterol (VENTOLIN HFA) 108 (90 Base) MCG/ACT inhaler 8 puff (8 puffs Inhalation Given 05/02/19 0459)     Initial Impression / Assessment and Plan / ED Course  I have reviewed the triage vital signs and the nursing notes.  Pertinent labs & imaging results that were available during my care of the patient were reviewed by me and considered in my medical decision making (see chart for details).        Patient with asthma exacerbation.  History of the same.  Is out of his inhaler.  Will give albuterol and reassess.  Patient feels improved after 2 rounds of albuterol.  Ambulates maintaining greater than 90% oxygenation.  Will discharge home with prednisone taper and inhalers.  Return precautions given.  Patient understands agrees the plan.  He is stable ready for discharge.  Final Clinical Impressions(s) / ED Diagnoses   Final diagnoses:  Exacerbation of asthma, unspecified asthma severity, unspecified whether persistent    ED Discharge Orders         Ordered    predniSONE (DELTASONE) 20 MG tablet  Daily     05/02/19 0656    albuterol (VENTOLIN HFA) 108 (90 Base) MCG/ACT inhaler  Every 4 hours PRN     05/02/19 0656           Roxy HorsemanBrowning, Rether Rison, PA-C 05/02/19 0657    Palumbo, April, MD 05/03/19 0111

## 2019-06-01 ENCOUNTER — Inpatient Hospital Stay (HOSPITAL_COMMUNITY)
Admission: EM | Admit: 2019-06-01 | Discharge: 2019-06-02 | DRG: 203 | Discharge status: Against Medical Advice | Payer: BLUE CROSS/BLUE SHIELD | Attending: Internal Medicine | Admitting: Internal Medicine

## 2019-06-01 ENCOUNTER — Emergency Department (HOSPITAL_COMMUNITY): Payer: BLUE CROSS/BLUE SHIELD

## 2019-06-01 ENCOUNTER — Other Ambulatory Visit: Payer: Self-pay

## 2019-06-01 ENCOUNTER — Encounter (HOSPITAL_COMMUNITY): Payer: Self-pay | Admitting: Emergency Medicine

## 2019-06-01 DIAGNOSIS — J4541 Moderate persistent asthma with (acute) exacerbation: Secondary | ICD-10-CM | POA: Diagnosis not present

## 2019-06-01 DIAGNOSIS — J45901 Unspecified asthma with (acute) exacerbation: Secondary | ICD-10-CM | POA: Diagnosis not present

## 2019-06-01 DIAGNOSIS — Z87891 Personal history of nicotine dependence: Secondary | ICD-10-CM | POA: Diagnosis not present

## 2019-06-01 DIAGNOSIS — F988 Other specified behavioral and emotional disorders with onset usually occurring in childhood and adolescence: Secondary | ICD-10-CM | POA: Diagnosis not present

## 2019-06-01 DIAGNOSIS — R0902 Hypoxemia: Secondary | ICD-10-CM | POA: Diagnosis present

## 2019-06-01 DIAGNOSIS — Z79899 Other long term (current) drug therapy: Secondary | ICD-10-CM

## 2019-06-01 DIAGNOSIS — Z20828 Contact with and (suspected) exposure to other viral communicable diseases: Secondary | ICD-10-CM | POA: Diagnosis present

## 2019-06-01 DIAGNOSIS — R000 Tachycardia, unspecified: Secondary | ICD-10-CM | POA: Diagnosis present

## 2019-06-01 DIAGNOSIS — D751 Secondary polycythemia: Secondary | ICD-10-CM | POA: Diagnosis present

## 2019-06-01 DIAGNOSIS — R062 Wheezing: Secondary | ICD-10-CM | POA: Diagnosis not present

## 2019-06-01 HISTORY — DX: Secondary polycythemia: D75.1

## 2019-06-01 LAB — BASIC METABOLIC PANEL
Anion gap: 11 (ref 5–15)
BUN: 9 mg/dL (ref 6–20)
CO2: 25 mmol/L (ref 22–32)
Calcium: 9.5 mg/dL (ref 8.9–10.3)
Chloride: 102 mmol/L (ref 98–111)
Creatinine, Ser: 0.94 mg/dL (ref 0.61–1.24)
GFR calc Af Amer: >60 mL/min (ref >60)
GFR calc non Af Amer: >60 mL/min (ref >60)
Glucose, Bld: 103 mg/dL — ABNORMAL HIGH (ref 70–99)
Potassium: 3.7 mmol/L (ref 3.5–5.1)
Sodium: 138 mmol/L (ref 135–145)

## 2019-06-01 LAB — SARS CORONAVIRUS 2 BY RT PCR (HOSPITAL ORDER, PERFORMED IN ~~LOC~~ HOSPITAL LAB): SARS Coronavirus 2: NEGATIVE

## 2019-06-01 LAB — CBC WITH DIFFERENTIAL/PLATELET
Abs Immature Granulocytes: 0.01 10*3/uL (ref 0.00–0.07)
Basophils Absolute: 0.1 10*3/uL (ref 0.0–0.1)
Basophils Relative: 1 %
Eosinophils Absolute: 0.8 10*3/uL — ABNORMAL HIGH (ref 0.0–0.5)
Eosinophils Relative: 11 %
HCT: 51.4 % (ref 39.0–52.0)
Hemoglobin: 17.1 g/dL — ABNORMAL HIGH (ref 13.0–17.0)
Immature Granulocytes: 0 %
Lymphocytes Relative: 32 %
Lymphs Abs: 2.4 10*3/uL (ref 0.7–4.0)
MCH: 30.6 pg (ref 26.0–34.0)
MCHC: 33.3 g/dL (ref 30.0–36.0)
MCV: 92.1 fL (ref 80.0–100.0)
Monocytes Absolute: 0.8 10*3/uL (ref 0.1–1.0)
Monocytes Relative: 10 %
Neutro Abs: 3.5 10*3/uL (ref 1.7–7.7)
Neutrophils Relative %: 46 %
Platelets: 261 10*3/uL (ref 150–400)
RBC: 5.58 MIL/uL (ref 4.22–5.81)
RDW: 12.3 % (ref 11.5–15.5)
WBC: 7.5 10*3/uL (ref 4.0–10.5)
nRBC: 0 % (ref 0.0–0.2)

## 2019-06-01 LAB — MAGNESIUM: Magnesium: 2.2 mg/dL (ref 1.7–2.4)

## 2019-06-01 MED ORDER — DILTIAZEM HCL 25 MG/5ML IV SOLN
10.0000 mg | Freq: Once | INTRAVENOUS | Status: AC
  Administered 2019-06-01: 10 mg via INTRAVENOUS
  Filled 2019-06-01: qty 5

## 2019-06-01 MED ORDER — GUAIFENESIN-DM 100-10 MG/5ML PO SYRP
5.0000 mL | ORAL_SOLUTION | ORAL | Status: DC | PRN
  Administered 2019-06-01 – 2019-06-02 (×3): 5 mL via ORAL
  Filled 2019-06-01 (×3): qty 5

## 2019-06-01 MED ORDER — ONDANSETRON HCL 4 MG/2ML IJ SOLN: 4.0000 mg | Freq: Four times a day (QID) | INTRAMUSCULAR | Status: DC | PRN

## 2019-06-01 MED ORDER — POTASSIUM CHLORIDE IN NACL 20-0.9 MEQ/L-% IV SOLN
INTRAVENOUS | Status: DC
  Administered 2019-06-01: 23:00:00 via INTRAVENOUS
  Filled 2019-06-01 (×3): qty 1000

## 2019-06-01 MED ORDER — IPRATROPIUM BROMIDE 0.02 % IN SOLN
0.5000 mg | Freq: Once | RESPIRATORY_TRACT | Status: AC
  Administered 2019-06-01: 0.5 mg via RESPIRATORY_TRACT
  Filled 2019-06-01: qty 2.5

## 2019-06-01 MED ORDER — IPRATROPIUM-ALBUTEROL 0.5-2.5 (3) MG/3ML IN SOLN
3.0000 mL | Freq: Four times a day (QID) | RESPIRATORY_TRACT | Status: DC
  Administered 2019-06-01 – 2019-06-02 (×5): 3 mL via RESPIRATORY_TRACT
  Filled 2019-06-01 (×5): qty 3

## 2019-06-01 MED ORDER — AEROCHAMBER PLUS FLO-VU LARGE MISC
1.0000 | Freq: Once | Status: AC
  Administered 2019-06-01: 1

## 2019-06-01 MED ORDER — ALBUTEROL SULFATE (2.5 MG/3ML) 0.083% IN NEBU: 2.5000 mg | INHALATION_SOLUTION | RESPIRATORY_TRACT | Status: DC | PRN

## 2019-06-01 MED ORDER — DEXAMETHASONE SODIUM PHOSPHATE 10 MG/ML IJ SOLN
10.0000 mg | Freq: Once | INTRAMUSCULAR | Status: AC
  Administered 2019-06-01: 10 mg via INTRAVENOUS
  Filled 2019-06-01: qty 1

## 2019-06-01 MED ORDER — MAGNESIUM SULFATE 2 GM/50ML IV SOLN
2.0000 g | Freq: Once | INTRAVENOUS | Status: AC
  Administered 2019-06-01: 2 g via INTRAVENOUS
  Filled 2019-06-01: qty 50

## 2019-06-01 MED ORDER — ACETAMINOPHEN 325 MG PO TABS
650.0000 mg | ORAL_TABLET | Freq: Four times a day (QID) | ORAL | Status: DC | PRN
  Administered 2019-06-01: 650 mg via ORAL
  Filled 2019-06-01: qty 2

## 2019-06-01 MED ORDER — SODIUM CHLORIDE 0.9 % IV BOLUS
1000.0000 mL | Freq: Once | INTRAVENOUS | Status: AC
  Administered 2019-06-01: 1000 mL via INTRAVENOUS

## 2019-06-01 MED ORDER — ALBUTEROL SULFATE HFA 108 (90 BASE) MCG/ACT IN AERS
4.0000 | INHALATION_SPRAY | Freq: Once | RESPIRATORY_TRACT | Status: AC
  Administered 2019-06-01: 4 via RESPIRATORY_TRACT
  Filled 2019-06-01: qty 6.7

## 2019-06-01 MED ORDER — POTASSIUM CHLORIDE CRYS ER 20 MEQ PO TBCR
40.0000 meq | EXTENDED_RELEASE_TABLET | Freq: Once | ORAL | Status: AC
  Administered 2019-06-01: 40 meq via ORAL
  Filled 2019-06-01: qty 2

## 2019-06-01 MED ORDER — ALBUTEROL (5 MG/ML) CONTINUOUS INHALATION SOLN
10.0000 mg/h | INHALATION_SOLUTION | Freq: Once | RESPIRATORY_TRACT | Status: AC
  Administered 2019-06-01: 10 mg/h via RESPIRATORY_TRACT
  Filled 2019-06-01: qty 20

## 2019-06-01 MED ORDER — ACETAMINOPHEN 650 MG RE SUPP: 650.0000 mg | Freq: Four times a day (QID) | RECTAL | Status: DC | PRN

## 2019-06-01 MED ORDER — ENOXAPARIN SODIUM 40 MG/0.4ML ~~LOC~~ SOLN
40.0000 mg | SUBCUTANEOUS | Status: DC
  Filled 2019-06-01: qty 0.4

## 2019-06-01 MED ORDER — ONDANSETRON HCL 4 MG PO TABS: 4.0000 mg | ORAL_TABLET | Freq: Four times a day (QID) | ORAL | Status: DC | PRN

## 2019-06-01 NOTE — H&P (Signed)
History and Physical    Mitchell Nelson JKD:[id number] DOB: Dec 09, 1995 DOA: 06/01/2019  PCP: Patient, No Pcp Per   Patient coming from: Home.  I have personally briefly reviewed patient's old medical records in [facility]  Chief Complaint: Shortness of breath.  HPI: Mitchell Nelson is a 24 y.o. male with medical history significant of ADD, asthma  who is coming to the emergency department with complaints of progressively worse dyspnea, cough, which is occasionally productive of light brown-colored sputum and wheezing since yesterday which has not responded to his rescue inhaler.  He denies fever, chills, exposure to sick contacts or travel history.  He denies chest pain, palpitations, diaphoresis, PND, orthopnea or pitting edema of the lower extremities.  He complains of occasional positional dizziness.  He denies abdominal pain, nausea, vomiting, diarrhea, constipation, melena or hematochezia.  No dysuria, frequency or hematuria.  Denies polyuria, polydipsia, polyphagia or blurred vision.  ED Course: Initial vital signs were temperature 97.6 F, pulse 118, respiratory rate 22, blood pressure 138/85 mmHg and O2 sat 93% on room air. The patient received supplemental oxygen, bronchodilators and Solu-Medrol.   EKG shows sinus tachycardia. Lab results only show a nonfasting glucose level of 103 mg/dL and polycythemia with a hemoglobin of 17.1 g/dL, all other values including x-rays are within normal limits.   Review of Systems: As per HPI otherwise 10 point review of systems negative.   Past Medical History:  Diagnosis Date  . ADD (attention deficit disorder)   . Asthma     History reviewed. No pertinent surgical history.   reports that he quit smoking about 9 months ago. He has a 1.50 pack-year smoking history. He has never used smokeless tobacco. He reports that he does not drink alcohol or use drugs.  No Known Allergies  Family History  Problem Relation Age  of Onset  . Diabetes Maternal Grandmother    Prior to Admission medications   Medication Sig Start Date End Date Taking? Authorizing Provider  albuterol (VENTOLIN HFA) 108 (90 Base) MCG/ACT inhaler Inhale 2 puffs into the lungs every 4 (four) hours as needed for wheezing or shortness of breath. 05/02/19  Yes Roxy Horseman, PA-C  cephALEXin (KEFLEX) 500 MG capsule Take 1 capsule (500 mg total) by mouth 3 (three) times daily. Patient not taking: Reported on 05/02/2019 10/12/13   Reuben Likes, MD  HYDROcodone-acetaminophen (NORCO/VICODIN) 5-325 MG per tablet 1 to 2 tabs every 4 to 6 hours as needed for pain. Patient not taking: Reported on 05/02/2019 10/12/13   Reuben Likes, MD  Mometasone Furoate [facility] HFA) 100 MCG/ACT AERO Inhale 2 puffs into the lungs 2 (two) times daily. Patient not taking: Reported on 01/14/2019 12/02/18   Nyoka Cowden, MD  predniSONE (DELTASONE) 20 MG tablet Take 2 tablets (40 mg total) by mouth daily. Take 40 mg by mouth daily for 3 days, then 20mg  by mouth daily for 3 days, then 10mg  daily for 3 days 05/02/19   Roxy Horseman, PA-C    Physical Exam: Vitals:   06/01/19 1215 06/01/19 1230 06/01/19 1245 06/01/19 1300  BP: 133/90 138/88 (!) 142/93 138/77  Pulse: (!) 116 (!) 125 (!) 118 (!) 113  Resp: 17 (!) 23 (!) 21 17  Temp:      SpO2: 93% 94% 93% 92%  Weight:      Height:        Constitutional: NAD, calm, comfortable Eyes: PERRL, lids and conjunctivae normal ENMT: Mucous membranes are mildly dry posterior pharynx  clear of any exudate or lesions. Neck: normal, supple, no masses, no thyromegaly Respiratory: Decreased breath sounds with bilateral rhonchi and wheezing, no crackles. Normal respiratory effort. No accessory muscle use.  Cardiovascular: Tachycardic at 122 bpm, no murmurs / rubs / gallops. No extremity edema. 2+ pedal pulses. No carotid bruits.  Abdomen: Soft, no tenderness, no masses palpated. No hepatosplenomegaly. Bowel sounds positive.   Musculoskeletal: no clubbing / cyanosis. Good ROM, no contractures. Normal muscle tone.  Skin: no rashes, lesions, ulcers on limited dermatological examination. Neurologic: CN 2-12 grossly intact. Sensation intact, DTR normal. Strength 5/5 in all 4.  Psychiatric: Normal judgment and insight. Alert and oriented x 3. Normal mood.   Labs on Admission: I have personally reviewed following labs and imaging studies  CBC: Recent Labs  Lab 06/01/19 0834  WBC 7.5  NEUTROABS 3.5  HGB 17.1*  HCT 51.4  MCV 92.1  PLT 261   Basic Metabolic Panel: Recent Labs  Lab 06/01/19 0834  NA 138  K 3.7  CL 102  CO2 25  GLUCOSE 103*  BUN 9  CREATININE 0.94  CALCIUM 9.5  MG 2.2   GFR: Estimated Creatinine Clearance: 137.2 mL/min (by C-G formula based on SCr of 0.94 mg/dL). Liver Function Tests: No results for input(s): AST, ALT, ALKPHOS, BILITOT, PROT, ALBUMIN in the last 168 hours. No results for input(s): LIPASE, AMYLASE in the last 168 hours. No results for input(s): AMMONIA in the last 168 hours. Coagulation Profile: No results for input(s): INR, PROTIME in the last 168 hours. Cardiac Enzymes: No results for input(s): CKTOTAL, CKMB, CKMBINDEX, TROPONINI in the last 168 hours. BNP (last 3 results) No results for input(s): PROBNP in the last 8760 hours. HbA1C: No results for input(s): HGBA1C in the last 72 hours. CBG: No results for input(s): GLUCAP in the last 168 hours. Lipid Profile: No results for input(s): CHOL, HDL, LDLCALC, TRIG, CHOLHDL, LDLDIRECT in the last 72 hours. Thyroid Function Tests: No results for input(s): TSH, T4TOTAL, FREET4, T3FREE, THYROIDAB in the last 72 hours. Anemia Panel: No results for input(s): VITAMINB12, FOLATE, FERRITIN, TIBC, IRON, RETICCTPCT in the last 72 hours. Urine analysis: No results found for: COLORURINE, APPEARANCEUR, LABSPEC, PHURINE, GLUCOSEU, HGBUR, BILIRUBINUR, KETONESUR, PROTEINUR, UROBILINOGEN, NITRITE, LEUKOCYTESUR  Radiological Exams  on Admission: Dg Chest 2 View  Result Date: 06/01/2019 CLINICAL DATA:  24 year old male with wheezing and shortness of breath. EXAM: CHEST - 2 VIEW COMPARISON:  02/15/2019 and earlier. FINDINGS: Lung volumes and mediastinal contours remain normal. Visualized tracheal air column is within normal limits. Both lungs appear clear. No pneumothorax or pleural effusion. Negative visible bowel gas. Minimal scoliosis. IMPRESSION: Negative.  No cardiopulmonary abnormality. Electronically Signed   By: Odessa Fleming M.D.   On: 06/01/2019 07:04    EKG: Independently reviewed. Vent. rate 114 BPM PR interval * ms QRS duration 99 ms QT/QTc 329/453 ms P-R-T axes 76 79 59 Sinus tachycardia No old tracing to compare  Assessment/Plan Principal Problem:   Asthma exacerbation Observation/telemetry. Continue supplemental oxygen. DuoNeb every 6 hours. Albuterol 2.5 mg neb every 4 hours as needed. Solu-Medrol 60 mg IVP every 6 hours. IV hydration with potassium supplementation.  Active Problems:   Polycythemia I believe this is a sign of poor asthma control. He should follow-up with pulmonology or PCP for treatment adjustment.    Sinus tachycardia Heart rate at times has been in the 140s. Secondary to albuterol use.  No other contributing factors are relatively low potassium. IV fluids with potassium supplementation. Continue cardiac monitoring.  DVT prophylaxis: Lovenox SQ. Code Status: Full code. Family Communication:  Disposition Plan: Ulceration for Asmus observation treatment. Consults called:  Admission status: Observation/telemetry.   Bobette Moavid Manuel Khameron Gruenwald MD [facility]  06/01/2019, 1:33 PM   This document was prepared using Dragon voice recognition software and may contain some unintended transcription errors.

## 2019-06-01 NOTE — ED Notes (Signed)
ED TO INPATIENT HANDOFF REPORT  ED Nurse Name and Phone #: Aldean Jewett [phone]  S Name/Age/Gender Mitchell Nelson Antrell 24 y.o. male Room/Bed: 027C/027C  Code Status   Code Status: Full Code  Home/SNF/Other Home Patient oriented to: self, place, time and situation Is this baseline? Yes   Triage Complete: Triage complete  Chief Complaint shob  Triage Note Patient reports hx of asthma with exacerbations typically 1-2 x/month,states he has been sob with wheezing since yesterday. Also endorses cough with clear phlegm. Has used inhaler without much relief. Denies fever or chills, no recent sick contacts.    Allergies No Known Allergies  Level of Care/Admitting Diagnosis ED Disposition    ED Disposition Condition Comment   Admit  Hospital Area: [facility] [[id number]]  Level of Care: Telemetry Medical [104]  I expect the patient will be discharged within 24 hours: Yes  LOW acuity---Tx typically complete <24 hrs---ACUTE conditions typically can be evaluated <24 hours---LABS likely to return to acceptable levels <24 hours---IS near functional baseline---EXPECTED to return to current living arrangement---NOT newly hypoxic: Meets criteria for 5C-Observation unit  Covid Evaluation: Confirmed COVID Negative  Diagnosis: Asthma exacerbation [256389]  Admitting Physician: Bobette Mo [[id number]]  Attending Physician: Bobette Mo [[id number]]  PT Class (Do Not Modify): Observation [104]  PT Acc Code (Do Not Modify): Observation [10022]       B Medical/Surgery History Past Medical History:  Diagnosis Date  . ADD (attention deficit disorder)   . Asthma    History reviewed. No pertinent surgical history.   A IV Location/Drains/Wounds Patient Lines/Drains/Airways Status   Active Line/Drains/Airways    Name:   Placement date:   Placement time:   Site:   Days:   Peripheral IV 11/15/18 Left Antecubital   11/15/18    -    Antecubital   198   Peripheral IV  06/01/19 Left Antecubital   06/01/19    0744    Antecubital   less than 1          Intake/Output Last 24 hours No intake or output data in the 24 hours ending 06/01/19 1412  Labs/Imaging Results for orders placed or performed during the hospital encounter of 06/01/19 (from the past 48 hour(s))  SARS Coronavirus 2 (CEPHEID- Performed in [facility] hospital lab), Hosp Order     Status: None   Collection Time: 06/01/19  8:34 AM  Result Value Ref Range   SARS Coronavirus 2 NEGATIVE NEGATIVE    Comment: (NOTE) If result is NEGATIVE SARS-CoV-2 target nucleic acids are NOT DETECTED. The SARS-CoV-2 RNA is generally detectable in upper and lower  respiratory specimens during the acute phase of infection. The lowest  concentration of SARS-CoV-2 viral copies this assay can detect is 250  copies / mL. A negative result does not preclude SARS-CoV-2 infection  and should not be used as the sole basis for treatment or other  patient management decisions.  A negative result may occur with  improper specimen collection / handling, submission of specimen other  than nasopharyngeal swab, presence of viral mutation(s) within the  areas targeted by this assay, and inadequate number of viral copies  (<250 copies / mL). A negative result must be combined with clinical  observations, patient history, and epidemiological information. If result is POSITIVE SARS-CoV-2 target nucleic acids are DETECTED. The SARS-CoV-2 RNA is generally detectable in upper and lower  respiratory specimens dur ing the acute phase of infection.  Positive  results are indicative of active infection  with SARS-CoV-2.  Clinical  correlation with patient history and other diagnostic information is  necessary to determine patient infection status.  Positive results do  not rule out bacterial infection or co-infection with other viruses. If result is PRESUMPTIVE POSTIVE SARS-CoV-2 nucleic acids MAY BE PRESENT.   A presumptive  positive result was obtained on the submitted specimen  and confirmed on repeat testing.  While 2019 novel coronavirus  (SARS-CoV-2) nucleic acids may be present in the submitted sample  additional confirmatory testing may be necessary for epidemiological  and / or clinical management purposes  to differentiate between  SARS-CoV-2 and other Sarbecovirus currently known to infect humans.  If clinically indicated additional testing with an alternate test  methodology [phone]([id number]) is advised. The SARS-CoV-2 RNA is generally  detectable in upper and lower respiratory sp ecimens during the acute  phase of infection. The expected result is Negative. Fact Sheet for Patients:  BoilerBrush.com.cyhttps://www.fda.gov/media/136312/download Fact Sheet for Healthcare Providers: https://pope.com/https://www.fda.gov/media/136313/download This test is not yet approved or cleared by the [place] FDA and has been authorized for detection and/or diagnosis of SARS-CoV-2 by FDA under an Emergency Use Authorization (EUA).  This EUA will remain in effect (meaning this test can be used) for the duration of the COVID-19 declaration under Section 564(b)(1) of the Act, 21 U.S.C. section 360bbb-3(b)(1), unless the authorization is terminated or revoked sooner. Performed at [facility], [address] [place], [place] [address]   Basic metabolic panel     Status: Abnormal   Collection Time: 06/01/19  8:34 AM  Result Value Ref Range   Sodium 138 135 - 145 mmol/L   Potassium 3.7 3.5 - 5.1 mmol/L   Chloride 102 98 - 111 mmol/L   CO2 25 22 - 32 mmol/L   Glucose, Bld 103 (H) 70 - 99 mg/dL   BUN 9 6 - 20 mg/dL   Creatinine, Ser 9.560.94 0.61 - 1.24 mg/dL   Calcium 9.5 8.9 - 21.310.3 mg/dL   GFR calc non Af Amer >60 >60 mL/min   GFR calc Af Amer >60 >60 mL/min   Anion gap 11 5 - 15    Comment: Performed at [facility], [address] [place], [place] [address]  CBC with Differential/Platelet     Status: Abnormal   Collection Time:  06/01/19  8:34 AM  Result Value Ref Range   WBC 7.5 4.0 - 10.5 K/uL   RBC 5.58 4.22 - 5.81 MIL/uL   Hemoglobin 17.1 (H) 13.0 - 17.0 g/dL   HCT 84.651.4 96.239.0 - 95.252.0 %   MCV 92.1 80.0 - 100.0 fL   MCH 30.6 26.0 - 34.0 pg   MCHC 33.3 30.0 - 36.0 g/dL   RDW 84.112.3 32.411.5 - 40.115.5 %   Platelets 261 150 - 400 K/uL   nRBC 0.0 0.0 - 0.2 %   Neutrophils Relative % 46 %   Neutro Abs 3.5 1.7 - 7.7 K/uL   Lymphocytes Relative 32 %   Lymphs Abs 2.4 0.7 - 4.0 K/uL   Monocytes Relative 10 %   Monocytes Absolute 0.8 0.1 - 1.0 K/uL   Eosinophils Relative 11 %   Eosinophils Absolute 0.8 (H) 0.0 - 0.5 K/uL   Basophils Relative 1 %   Basophils Absolute 0.1 0.0 - 0.1 K/uL   Immature Granulocytes 0 %   Abs Immature Granulocytes 0.01 0.00 - 0.07 K/uL    Comment: Performed at [facility], [address] [place], [place] [address]  Magnesium  Status: None   Collection Time: 06/01/19  8:34 AM  Result Value Ref Range   Magnesium 2.2 1.7 - 2.4 mg/dL    Comment: Performed at [facility], [address], [place], [place] [address]   Dg Chest 2 View  Result Date: 06/01/2019 CLINICAL DATA:  24 year old male with wheezing and shortness of breath. EXAM: CHEST - 2 VIEW COMPARISON:  02/15/2019 and earlier. FINDINGS: Lung volumes and mediastinal contours remain normal. Visualized tracheal air column is within normal limits. Both lungs appear clear. No pneumothorax or pleural effusion. Negative visible bowel gas. Minimal scoliosis. IMPRESSION: Negative.  No cardiopulmonary abnormality. Electronically Signed   By: Odessa Fleming M.D.   On: 06/01/2019 07:04    Pending Labs Unresulted Labs (From admission, onward)    Start     Ordered   06/08/19 0500  Creatinine, serum  (enoxaparin (LOVENOX)    CrCl >/= 30 ml/min)  Weekly,   R    Comments:  while on enoxaparin therapy    06/01/19 1321          Vitals/Pain Today's Vitals   06/01/19 1315 06/01/19 1330 06/01/19 1345 06/01/19 1400  BP: 135/84 122/77 (!) 133/99  (!) 135/91  Pulse: (!) 105 (!) 108    Resp: Temp:      SpO2: 96% 98%    Weight:      Height:      PainSc:        Isolation Precautions Droplet and Contact precautions  Medications Medications  enoxaparin (LOVENOX) injection 40 mg (has no administration in time range)  ondansetron (ZOFRAN) tablet 4 mg (has no administration in time range)    Or  ondansetron (ZOFRAN) injection 4 mg (has no administration in time range)  acetaminophen (TYLENOL) tablet 650 mg (has no administration in time range)    Or  acetaminophen (TYLENOL) suppository 650 mg (has no administration in time range)  ipratropium-albuterol (DUONEB) 0.5-2.5 (3) MG/3ML nebulizer solution 3 mL (has no administration in time range)  albuterol (PROVENTIL) (2.5 MG/3ML) 0.083% nebulizer solution 2.5 mg (has no administration in time range)  0.9 % NaCl with KCl 20 mEq/ L  infusion (has no administration in time range)  albuterol (VENTOLIN HFA) 108 (90 Base) MCG/ACT inhaler 4 puff (4 puffs Inhalation Given 06/01/19 0744)  AeroChamber Plus Flo-Vu Large MISC 1 each (1 each Other Given 06/01/19 0744)  dexamethasone (DECADRON) injection 10 mg (10 mg Intravenous Given 06/01/19 0750)  magnesium sulfate IVPB 2 g 50 mL (0 g Intravenous Stopped 06/01/19 0901)  ipratropium (ATROVENT) nebulizer solution 0.5 mg (0.5 mg Nebulization Given 06/01/19 1010)  albuterol (PROVENTIL,VENTOLIN) solution continuous neb (10 mg/hr Nebulization Given 06/01/19 1011)    Mobility walks Low fall risk   Focused Assessments    R Recommendations: See Admitting Provider Note  Report given to:   Additional

## 2019-06-01 NOTE — ED Provider Notes (Signed)
Not in room   Margarita Grizzle, MD 06/01/19 [id number]

## 2019-06-01 NOTE — ED Triage Notes (Signed)
Patient reports hx of asthma with exacerbations typically 1-2 x/month,states he has been sob with wheezing since yesterday. Also endorses cough with clear phlegm. Has used inhaler without much relief. Denies fever or chills, no recent sick contacts.

## 2019-06-01 NOTE — ED Provider Notes (Addendum)
[facility] EMERGENCY DEPARTMENT Provider Note   CSN: [id number] Arrival date & time: 06/01/19  0334    History   Chief Complaint Chief Complaint  Patient presents with  . Asthma    HPI [facility] Octavien is a 24 y.o. male.     HPI   [facility] Alhassan is a 24 y.o. male, with a history of Asthma, presenting to the ED with shortness of breath beginning yesterday.  States it feels like an asthma exacerbation.  He estimates he has used 120 puffs of his albuterol inhaler since yesterday.  He also notes a cough that he thinks is connected to this asthma exacerbation also beginning yesterday.  Denies tobacco use, illicit drug use, alcohol use.  He has had previous admissions for his asthma, but never to ICU and no history of intubation.  Denies fever/chills, chest pain, abdominal pain, N/V/D, syncope, dizziness, hemoptysis, or any other complaints.    Past Medical History:  Diagnosis Date  . ADD (attention deficit disorder)   . Asthma     Patient Active Problem List   Diagnosis Date Noted  . Asthma exacerbation 06/01/2019  . Cough variant asthma 12/03/2018  . ADD (attention deficit disorder) 03/28/2015    History reviewed. No pertinent surgical history.      Home Medications    Prior to Admission medications   Medication Sig Start Date End Date Taking? Authorizing Provider  albuterol (VENTOLIN HFA) 108 (90 Base) MCG/ACT inhaler Inhale 2 puffs into the lungs every 4 (four) hours as needed for wheezing or shortness of breath. 05/02/19  Yes Roxy Horseman, PA-C  cephALEXin (KEFLEX) 500 MG capsule Take 1 capsule (500 mg total) by mouth 3 (three) times daily. Patient not taking: Reported on 05/02/2019 10/12/13   Reuben Likes, MD  HYDROcodone-acetaminophen (NORCO/VICODIN) 5-325 MG per tablet 1 to 2 tabs every 4 to 6 hours as needed for pain. Patient not taking: Reported on 05/02/2019 10/12/13   Reuben Likes, MD  Mometasone Furoate  [facility] HFA) 100 MCG/ACT AERO Inhale 2 puffs into the lungs 2 (two) times daily. Patient not taking: Reported on 01/14/2019 12/02/18   Nyoka Cowden, MD  predniSONE (DELTASONE) 20 MG tablet Take 2 tablets (40 mg total) by mouth daily. Take 40 mg by mouth daily for 3 days, then 20mg  by mouth daily for 3 days, then 10mg  daily for 3 days 05/02/19   Roxy Horseman, PA-C    Family History Family History  Problem Relation Age of Onset  . Diabetes Maternal Grandmother     Social History Social History   Tobacco Use  . Smoking status: Former Smoker    Packs/day: 0.25    Years: 6.00    Pack years: 1.50    Last attempt to quit: 08/31/2018    Years since quitting: 0.7  . Smokeless tobacco: Never Used  Substance Use Topics  . Alcohol use: No    Alcohol/week: 0.0 standard drinks  . Drug use: No     Allergies   Patient has no known allergies.   Review of Systems Review of Systems  Constitutional: Negative for chills, diaphoresis and fever.  Respiratory: Positive for cough and shortness of breath.   Cardiovascular: Negative for chest pain.  Gastrointestinal: Negative for abdominal pain, diarrhea, nausea and vomiting.  Neurological: Negative for dizziness, syncope, weakness and light-headedness.  All other systems reviewed and are negative.    Physical Exam Updated Vital Signs BP 138/85   Pulse (!) 118   Temp  97.6 F (36.4 C)   Resp (!) 22   SpO2 93%   Physical Exam Vitals signs and nursing note reviewed.  Constitutional:      General: He is not in acute distress.    Appearance: He is well-developed. He is not diaphoretic.  HENT:     Head: Normocephalic and atraumatic.     Mouth/Throat:     Mouth: Mucous membranes are moist.     Pharynx: Oropharynx is clear.  Eyes:     Conjunctiva/sclera: Conjunctivae normal.  Neck:     Musculoskeletal: Neck supple.  Cardiovascular:     Rate and Rhythm: Regular rhythm. Tachycardia present.     Pulses: Normal pulses.           Radial pulses are 2+ on the right side and 2+ on the left side.       Posterior tibial pulses are 2+ on the right side and 2+ on the left side.     Heart sounds: Normal heart sounds.     Comments: Tactile temperature in the extremities appropriate and equal bilaterally. Pulmonary:     Effort: Tachypnea and respiratory distress present.     Breath sounds: Decreased breath sounds and wheezing (global, inspiratory and expiratory) present.     Comments: Increased work of breathing Abdominal:     Palpations: Abdomen is soft.     Tenderness: There is no abdominal tenderness. There is no guarding.  Musculoskeletal:     Right lower leg: No edema.     Left lower leg: No edema.  Lymphadenopathy:     Cervical: No cervical adenopathy.  Skin:    General: Skin is warm and dry.  Neurological:     Mental Status: He is alert.  Psychiatric:        Mood and Affect: Mood and affect normal.        Speech: Speech normal.        Behavior: Behavior normal.      ED Treatments / Results  Labs (all labs ordered are listed, but only abnormal results are displayed) Labs Reviewed  BASIC METABOLIC PANEL - Abnormal; Notable for the following components:      Result Value   Glucose, Bld 103 (*)    All other components within normal limits  CBC WITH DIFFERENTIAL/PLATELET - Abnormal; Notable for the following components:   Hemoglobin 17.1 (*)    Eosinophils Absolute 0.8 (*)    All other components within normal limits  SARS CORONAVIRUS 2 (HOSPITAL ORDER, PERFORMED IN [facility])  MAGNESIUM    EKG EKG Interpretation  Date/Time:  Monday June 01 2019 08:32:50 EDT Ventricular Rate:  114 PR Interval:    QRS Duration: 99 QT Interval:  329 QTC Calculation: 453 R Axis:   79 Text Interpretation:  Sinus tachycardia No old tracing to compare Confirmed by Margarita Grizzleay, Danielle [phone]([id number]) on 06/01/2019 11:16:46 AM   Radiology Dg Chest 2 View  Result Date: 06/01/2019 CLINICAL DATA:  24 year old male with  wheezing and shortness of breath. EXAM: CHEST - 2 VIEW COMPARISON:  02/15/2019 and earlier. FINDINGS: Lung volumes and mediastinal contours remain normal. Visualized tracheal air column is within normal limits. Both lungs appear clear. No pneumothorax or pleural effusion. Negative visible bowel gas. Minimal scoliosis. IMPRESSION: Negative.  No cardiopulmonary abnormality. Electronically Signed   By: Odessa FlemingH  Hall M.D.   On: 06/01/2019 07:04    Procedures .Critical Care Performed by: Anselm PancoastJoy, Pami Wool C, PA-C Authorized by: Anselm PancoastJoy, Aleene Swanner C, PA-C   Critical care  provider statement:    Critical care time (minutes):  45   Critical care time was exclusive of:  Separately billable procedures and treating other patients   Critical care was necessary to treat or prevent imminent or life-threatening deterioration of the following conditions:  Respiratory failure   Critical care was time spent personally by me on the following activities:  Development of treatment plan with patient or surrogate, discussions with consultants, evaluation of patient's response to treatment, examination of patient, obtaining history from patient or surrogate, ordering and performing treatments and interventions, ordering and review of laboratory studies, ordering and review of radiographic studies, pulse oximetry, re-evaluation of patient's condition and review of old charts   I assumed direction of critical care for this patient from another provider in my specialty: no     (including critical care time)  Medications Ordered in ED Medications  enoxaparin (LOVENOX) injection 40 mg (has no administration in time range)  ondansetron (ZOFRAN) tablet 4 mg (has no administration in time range)    Or  ondansetron (ZOFRAN) injection 4 mg (has no administration in time range)  acetaminophen (TYLENOL) tablet 650 mg (has no administration in time range)    Or  acetaminophen (TYLENOL) suppository 650 mg (has no administration in time range)   albuterol (VENTOLIN HFA) 108 (90 Base) MCG/ACT inhaler 4 puff (4 puffs Inhalation Given 06/01/19 0744)  AeroChamber Plus Flo-Vu Large MISC 1 each (1 each Other Given 06/01/19 0744)  dexamethasone (DECADRON) injection 10 mg (10 mg Intravenous Given 06/01/19 0750)  magnesium sulfate IVPB 2 g 50 mL (0 g Intravenous Stopped 06/01/19 0901)  ipratropium (ATROVENT) nebulizer solution 0.5 mg (0.5 mg Nebulization Given 06/01/19 1010)  albuterol (PROVENTIL,VENTOLIN) solution continuous neb (10 mg/hr Nebulization Given 06/01/19 1011)     Initial Impression / Assessment and Plan / ED Course  I have reviewed the triage vital signs and the nursing notes.  Pertinent labs & imaging results that were available during my care of the patient were reviewed by me and considered in my medical decision making (see chart for details).  Clinical Course as of May 31 1322  Mon Jun 01, 2019  0725 Placed on supplemental O2 for SPO2 of 90%.   [SJ]  0800 Patient endorses no improvement following albuterol inhaler puffs.   [SJ]  0830 Patient states he is starting to notice improvement in his breathing.  Continues to have inspiratory and expiratory wheezing.  SPO2 95 to 97% on 2 L supplemental O2.   [SJ]  0925 States he feels better.  Continues to have inspiratory and expiratory wheezing. SPO2 94% on room air.   [SJ]  1053 Wheezing continues, but states he feel "a lot better."  SPO2 100% while receiving continuous neb.   [SJ]    Clinical Course User Index [SJ] Atlee Villers C, PA-C       Patient presents with shortness of breath and wheezing.  Suspect asthma exacerbation.  He had some improvement over his course, but patient's SPO2 dropped down to 92% during ambulation with tachycardia at 144. Even though there was some improvement, patient continues to have inspiratory and expiratory wheezing. Chest x-ray without acute abnormality.  Lab work overall reassuring.  COVID-19 negative. Due to patient's persistent symptoms hypoxia,  and wheezing, patient admitted.  Findings and plan of care discussed with Margarita Grizzle, MD. Dr. Rosalia Hammers personally evaluated and examined this patient.  Vitals:   06/01/19 1215 06/01/19 1230 06/01/19 1245 06/01/19 1300  BP: 133/90 138/88 (!) 142/93 138/77  Pulse: (!) 116 (!) 125 (!) 118 (!) 113  Resp: 17 (!) 23 (!) 21 17  Temp:      SpO2: 93% 94% 93% 92%  Weight:      Height:         Final Clinical Impressions(s) / ED Diagnoses   Final diagnoses:  Moderate persistent asthma with exacerbation    ED Discharge Orders    None       Concepcion Living 06/01/19 1347    Anselm Pancoast, PA-C 06/01/19 1347    Margarita Grizzle, MD 06/03/19 1250

## 2019-06-01 NOTE — ED Notes (Signed)
Sister [phone]

## 2019-06-01 NOTE — ED Notes (Signed)
Walked patient around nursing station patient oxygen level stayed at 92 room air heart was at 144 patient is now back in bed on monitor call bell in reach

## 2019-06-01 NOTE — ED Notes (Signed)
RT contacted for neb.

## 2019-06-01 NOTE — ED Notes (Signed)
Spoke with pt sister and update given.

## 2019-06-02 DIAGNOSIS — Z87891 Personal history of nicotine dependence: Secondary | ICD-10-CM | POA: Diagnosis not present

## 2019-06-02 DIAGNOSIS — J4541 Moderate persistent asthma with (acute) exacerbation: Principal | ICD-10-CM

## 2019-06-02 DIAGNOSIS — Z79899 Other long term (current) drug therapy: Secondary | ICD-10-CM | POA: Diagnosis not present

## 2019-06-02 DIAGNOSIS — D751 Secondary polycythemia: Secondary | ICD-10-CM

## 2019-06-02 DIAGNOSIS — R000 Tachycardia, unspecified: Secondary | ICD-10-CM | POA: Diagnosis not present

## 2019-06-02 DIAGNOSIS — F988 Other specified behavioral and emotional disorders with onset usually occurring in childhood and adolescence: Secondary | ICD-10-CM | POA: Diagnosis present

## 2019-06-02 DIAGNOSIS — R0902 Hypoxemia: Secondary | ICD-10-CM | POA: Diagnosis present

## 2019-06-02 DIAGNOSIS — Z20828 Contact with and (suspected) exposure to other viral communicable diseases: Secondary | ICD-10-CM | POA: Diagnosis present

## 2019-06-02 MED ORDER — BENZONATATE 100 MG PO CAPS
100.0000 mg | ORAL_CAPSULE | Freq: Three times a day (TID) | ORAL | Status: DC
  Administered 2019-06-02 (×2): 100 mg via ORAL
  Filled 2019-06-02 (×2): qty 1

## 2019-06-02 MED ORDER — METHYLPREDNISOLONE SODIUM SUCC 125 MG IJ SOLR
60.0000 mg | Freq: Two times a day (BID) | INTRAMUSCULAR | Status: DC
  Administered 2019-06-02: 60 mg via INTRAVENOUS
  Filled 2019-06-02: qty 2

## 2019-06-02 NOTE — Progress Notes (Signed)
PROGRESS NOTE    Tatsuya Okray  NWG:[id number] DOB: 06-May-1995 DOA: 06/01/2019 PCP: Patient, No Pcp Per  Brief Narrative: 24 year old male with history of asthma, ADD presented to the emergency room 6/1 with worsening dyspnea cough and wheezing. -Chest x-ray was unremarkable, COVID PCR was negative   Assessment & Plan:   Principal Problem:   Asthma exacerbation -Unfortunately remains quite symptomatic from this with only marginal improvement thus far -Continue IV Solu-Medrol today, duo nebs every 6 and as needed -COVID PCR is negative -Continue Mucinex, add Tessalon Perles -Flutter valve    Polycythemia -Please secondary to mild chronic hypoxemia from uncontrolled asthma -Advised importance of pulmonary follow-up    Sinus tachycardia -Due to asthma exacerbation and albuterol nebs -Improving, monitor  DVT prophylaxis: Lovenox Code Status: Full code Family Communication: Discussed in detail with the patient Disposition Plan: Home pending clinical improvement  Consultants:      Procedures:   Antimicrobials:    Subjective: -Complains of shortness of breath wheezing and severe profuse cough, marginally better from yesterday  Objective: Vitals:   06/02/19 0030 06/02/19 0348 06/02/19 0721 06/02/19 0733  BP: 103/61  102/71   Pulse: (!) 108  (!) 107   Resp:   18   Temp: 98.4 F (36.9 C)  98.2 F (36.8 C)   TempSrc: Oral  Oral   SpO2: 95% 99% 96% 100%  Weight:      Height:        Intake/Output Summary (Last 24 hours) at 06/02/2019 1140 Last data filed at 06/02/2019 1025 Gross per 24 hour  Intake 1729.8 ml  Output --  Net 1729.8 ml   Filed Weights   06/01/19 1206  Weight: 90.7 kg    Examination:  General exam: Awake alert, mild distress, severe persistent cough Respiratory system: Poor air movement bilaterally with diffuse expiratory wheezes Cardiovascular system: S1 & S2 heard, tachycardic  gastrointestinal system: Abdomen is nondistended,  soft and nontender.Normal bowel sounds heard. Central nervous system: Alert and oriented. No focal neurological deficits. Extremities: No edema Skin: No rashes, lesions or ulcers Psychiatry: Judgement and insight appear normal. Mood & affect appropriate.     Data Reviewed:   CBC: Recent Labs  Lab 06/01/19 0834  WBC 7.5  NEUTROABS 3.5  HGB 17.1*  HCT 51.4  MCV 92.1  PLT 261   Basic Metabolic Panel: Recent Labs  Lab 06/01/19 0834  NA 138  K 3.7  CL 102  CO2 25  GLUCOSE 103*  BUN 9  CREATININE 0.94  CALCIUM 9.5  MG 2.2   GFR: Estimated Creatinine Clearance: 137.2 mL/min (by C-G formula based on SCr of 0.94 mg/dL). Liver Function Tests: No results for input(s): AST, ALT, ALKPHOS, BILITOT, PROT, ALBUMIN in the last 168 hours. No results for input(s): LIPASE, AMYLASE in the last 168 hours. No results for input(s): AMMONIA in the last 168 hours. Coagulation Profile: No results for input(s): INR, PROTIME in the last 168 hours. Cardiac Enzymes: No results for input(s): CKTOTAL, CKMB, CKMBINDEX, TROPONINI in the last 168 hours. BNP (last 3 results) No results for input(s): PROBNP in the last 8760 hours. HbA1C: No results for input(s): HGBA1C in the last 72 hours. CBG: No results for input(s): GLUCAP in the last 168 hours. Lipid Profile: No results for input(s): CHOL, HDL, LDLCALC, TRIG, CHOLHDL, LDLDIRECT in the last 72 hours. Thyroid Function Tests: No results for input(s): TSH, T4TOTAL, FREET4, T3FREE, THYROIDAB in the last 72 hours. Anemia Panel: No results for input(s): VITAMINB12, FOLATE, FERRITIN, TIBC,  IRON, RETICCTPCT in the last 72 hours. Urine analysis: No results found for: COLORURINE, APPEARANCEUR, LABSPEC, PHURINE, GLUCOSEU, HGBUR, BILIRUBINUR, KETONESUR, PROTEINUR, UROBILINOGEN, NITRITE, LEUKOCYTESUR Sepsis Labs: @LABRCNTIP (procalcitonin:4,lacticidven:4)  ) Recent Results (from the past 240 hour(s))  SARS Coronavirus 2 (CEPHEID- Performed in [facility] lab), Hosp Order     Status: None   Collection Time: 06/01/19  8:34 AM  Result Value Ref Range Status   SARS Coronavirus 2 NEGATIVE NEGATIVE Final    Comment: (NOTE) If result is NEGATIVE SARS-CoV-2 target nucleic acids are NOT DETECTED. The SARS-CoV-2 RNA is generally detectable in upper and lower  respiratory specimens during the acute phase of infection. The lowest  concentration of SARS-CoV-2 viral copies this assay can detect is 250  copies / mL. A negative result does not preclude SARS-CoV-2 infection  and should not be used as the sole basis for treatment or other  patient management decisions.  A negative result may occur with  improper specimen collection / handling, submission of specimen other  than nasopharyngeal swab, presence of viral mutation(s) within the  areas targeted by this assay, and inadequate number of viral copies  (<250 copies / mL). A negative result must be combined with clinical  observations, patient history, and epidemiological information. If result is POSITIVE SARS-CoV-2 target nucleic acids are DETECTED. The SARS-CoV-2 RNA is generally detectable in upper and lower  respiratory specimens dur ing the acute phase of infection.  Positive  results are indicative of active infection with SARS-CoV-2.  Clinical  correlation with patient history and other diagnostic information is  necessary to determine patient infection status.  Positive results do  not rule out bacterial infection or co-infection with other viruses. If result is PRESUMPTIVE POSTIVE SARS-CoV-2 nucleic acids MAY BE PRESENT.   A presumptive positive result was obtained on the submitted specimen  and confirmed on repeat testing.  While 2019 novel coronavirus  (SARS-CoV-2) nucleic acids may be present in the submitted sample  additional confirmatory testing may be necessary for epidemiological  and / or clinical management purposes  to differentiate between  SARS-CoV-2 and  other Sarbecovirus currently known to infect humans.  If clinically indicated additional testing with an alternate test  methodology [id number]) is advised. The SARS-CoV-2 RNA is generally  detectable in upper and lower respiratory sp ecimens during the acute  phase of infection. The expected result is Negative. Fact Sheet for Patients:  BoilerBrush.com.cyhttps://www.fda.gov/media/136312/download Fact Sheet for Healthcare Providers: https://pope.com/https://www.fda.gov/media/136313/download This test is not yet approved or cleared by the [place] FDA and has been authorized for detection and/or diagnosis of SARS-CoV-2 by FDA under an Emergency Use Authorization (EUA).  This EUA will remain in effect (meaning this test can be used) for the duration of the COVID-19 declaration under Section 564(b)(1) of the Act, 21 U.S.C. section 360bbb-3(b)(1), unless the authorization is terminated or revoked sooner. Performed at [facility], [address] [place], [place] [address]          Radiology Studies: Dg Chest 2 View  Result Date: 06/01/2019 CLINICAL DATA:  24 year old male with wheezing and shortness of breath. EXAM: CHEST - 2 VIEW COMPARISON:  02/15/2019 and earlier. FINDINGS: Lung volumes and mediastinal contours remain normal. Visualized tracheal air column is within normal limits. Both lungs appear clear. No pneumothorax or pleural effusion. Negative visible bowel gas. Minimal scoliosis. IMPRESSION: Negative.  No cardiopulmonary abnormality. Electronically Signed   By: Odessa FlemingH  Hall M.D.   On: 06/01/2019 07:04  Scheduled Meds:  benzonatate  100 mg Oral TID   enoxaparin (LOVENOX) injection  40 mg Subcutaneous Q24H   ipratropium-albuterol  3 mL Nebulization Q6H   methylPREDNISolone (SOLU-MEDROL) injection  60 mg Intravenous Q12H   Continuous Infusions:   LOS: 0 days    Time spent:    Zannie Cove, MD [facility]  06/02/2019, 11:40 AM

## 2019-06-02 NOTE — Progress Notes (Signed)
Pt reported by cna seeing left room. Pt noted with hoodie on and belongings headed to elevator.Pt stated they he wants to leave. Pt informed that he can speak with his doctor and determine his best course of action. Pt stated wants to see/speak with doctor today. Pt informed will page doctor for patient. MD made aware.

## 2019-06-02 NOTE — Progress Notes (Signed)
Spoke with pt about eating habits. Pt states has not been eating much d/t fear of n/v. Pt educated on prn meds that may assist pt with feelings of n/v. Pt educated in ordering meals.

## 2019-06-02 NOTE — Progress Notes (Signed)
Pt spoke with MD and decided to leave AMA.

## 2019-06-10 NOTE — Discharge Summary (Signed)
Physician Discharge Summary  [facility] NWG:[id number] DOB: Apr 06, 1995 DOA: 06/01/2019  PCP: Patient, No Pcp Per  Admit date: 06/01/2019 Discharge date: 06/02/2019  Time spent: 35 minutes  Recommendations for Outpatient Follow-up:  1. Left AMA   Discharge Diagnoses:  Principal Problem:   Asthma exacerbation Active Problems:   Polycythemia   Sinus tachycardia   Discharge Condition: improving  Diet recommendation: regular  Filed Weights   06/01/19 1206  Weight: 90.7 kg    History of present illness:  24 year old male with history of asthma, ADD presented to the emergency room 6/1 with worsening dyspnea cough and wheezing.  Hospital Course:    Asthma exacerbation -admitted late last night, when I saw him late morning, unfortunately remained quite symptomatic from this with only marginal improvement thus far -hence restarted on IV Solu-Medrol, duo nebs every 6 and as needed -COVID PCR is negative -Continue Mucinex, add Tessalon Perles -Flutter valve -by early evening pt was caught by [employer] sneaking into the elevator wearing his home clothes trying to leave AMA, RN brought him back to the Room, I called and recommended that pt stay overnight since he was still wheezing a lot and quite tachycardic, but he insisted he felt better and left AMA    Polycythemia -Please secondary to mild chronic hypoxemia from uncontrolled asthma -Advised importance of pulmonary follow-up    Sinus tachycardia -Due to asthma exacerbation and albuterol nebs -Improving, monitor  Discharge Exam: Vitals:   06/02/19 1359 06/02/19 1642  BP:  117/81  Pulse:  (!) 102  Resp:  16  Temp:  98.7 F (37.1 C)  SpO2: 97% 97%     Discharge Instructions    Allergies as of 06/02/2019   No Known Allergies     Medication List    ASK your doctor about these medications   albuterol 108 (90 Base) MCG/ACT inhaler Commonly known as:  VENTOLIN HFA Inhale 2 puffs into the lungs every 4  (four) hours as needed for wheezing or shortness of breath.   cephALEXin 500 MG capsule Commonly known as:  KEFLEX Take 1 capsule (500 mg total) by mouth 3 (three) times daily.   HYDROcodone-acetaminophen 5-325 MG tablet Commonly known as:  NORCO/VICODIN 1 to 2 tabs every 4 to 6 hours as needed for pain.   Mometasone Furoate 100 MCG/ACT Aero Commonly known as:  Asmanex HFA Inhale 2 puffs into the lungs 2 (two) times daily.   predniSONE 20 MG tablet Commonly known as:  DELTASONE Take 2 tablets (40 mg total) by mouth daily. Take 40 mg by mouth daily for 3 days, then 20mg  by mouth daily for 3 days, then 10mg  daily for 3 days      No Known Allergies    The results of significant diagnostics from this hospitalization (including imaging, microbiology, ancillary and laboratory) are listed below for reference.    Significant Diagnostic Studies: Dg Chest 2 View  Result Date: 06/01/2019 CLINICAL DATA:  24 year old male with wheezing and shortness of breath. EXAM: CHEST - 2 VIEW COMPARISON:  02/15/2019 and earlier. FINDINGS: Lung volumes and mediastinal contours remain normal. Visualized tracheal air column is within normal limits. Both lungs appear clear. No pneumothorax or pleural effusion. Negative visible bowel gas. Minimal scoliosis. IMPRESSION: Negative.  No cardiopulmonary abnormality. Electronically Signed   By: Genevie Ann M.D.   On: 06/01/2019 07:04    Microbiology: Recent Results (from the past 240 hour(s))  SARS Coronavirus 2 (CEPHEID- Performed in [facility] hospital lab), [facility]  Status: None   Collection Time: 06/01/19  8:34 AM  Result Value Ref Range Status   SARS Coronavirus 2 NEGATIVE NEGATIVE Final    Comment: (NOTE) If result is NEGATIVE SARS-CoV-2 target nucleic acids are NOT DETECTED. The SARS-CoV-2 RNA is generally detectable in upper and lower  respiratory specimens during the acute phase of infection. The lowest  concentration of SARS-CoV-2 viral copies  this assay can detect is 250  copies / mL. A negative result does not preclude SARS-CoV-2 infection  and should not be used as the sole basis for treatment or other  patient management decisions.  A negative result may occur with  improper specimen collection / handling, submission of specimen other  than nasopharyngeal swab, presence of viral mutation(s) within the  areas targeted by this assay, and inadequate number of viral copies  (<250 copies / mL). A negative result must be combined with clinical  observations, patient history, and epidemiological information. If result is POSITIVE SARS-CoV-2 target nucleic acids are DETECTED. The SARS-CoV-2 RNA is generally detectable in upper and lower  respiratory specimens dur ing the acute phase of infection.  Positive  results are indicative of active infection with SARS-CoV-2.  Clinical  correlation with patient history and other diagnostic information is  necessary to determine patient infection status.  Positive results do  not rule out bacterial infection or co-infection with other viruses. If result is PRESUMPTIVE POSTIVE SARS-CoV-2 nucleic acids MAY BE PRESENT.   A presumptive positive result was obtained on the submitted specimen  and confirmed on repeat testing.  While 2019 novel coronavirus  (SARS-CoV-2) nucleic acids may be present in the submitted sample  additional confirmatory testing may be necessary for epidemiological  and / or clinical management purposes  to differentiate between  SARS-CoV-2 and other Sarbecovirus currently known to infect humans.  If clinically indicated additional testing with an alternate test  methodology [phone]([id number]) is advised. The SARS-CoV-2 RNA is generally  detectable in upper and lower respiratory sp ecimens during the acute  phase of infection. The expected result is Negative. Fact Sheet for Patients:  BoilerBrush.com.cyhttps://www.fda.gov/media/136312/download Fact Sheet for Healthcare  Providers: https://pope.com/https://www.fda.gov/media/136313/download This test is not yet approved or cleared by the [place] FDA and has been authorized for detection and/or diagnosis of SARS-CoV-2 by FDA under an Emergency Use Authorization (EUA).  This EUA will remain in effect (meaning this test can be used) for the duration of the COVID-19 declaration under Section 564(b)(1) of the Act, 21 U.S.C. section 360bbb-3(b)(1), unless the authorization is terminated or revoked sooner. Performed at [facility], [address] [place], [place] [address]      Labs: Basic Metabolic Panel: No results for input(s): NA, K, CL, CO2, GLUCOSE, BUN, CREATININE, CALCIUM, MG, PHOS in the last 168 hours. Liver Function Tests: No results for input(s): AST, ALT, ALKPHOS, BILITOT, PROT, ALBUMIN in the last 168 hours. No results for input(s): LIPASE, AMYLASE in the last 168 hours. No results for input(s): AMMONIA in the last 168 hours. CBC: No results for input(s): WBC, NEUTROABS, HGB, HCT, MCV, PLT in the last 168 hours. Cardiac Enzymes: No results for input(s): CKTOTAL, CKMB, CKMBINDEX, TROPONINI in the last 168 hours. BNP: BNP (last 3 results) No results for input(s): BNP in the last 8760 hours.  ProBNP (last 3 results) No results for input(s): PROBNP in the last 8760 hours.  CBG: No results for input(s): GLUCAP in the last 168 hours.     Signed:  Zannie CovePreetha Callyn Severtson MD.  [facility] 06/10/2019, 10:56 AM

## 2019-06-20 ENCOUNTER — Other Ambulatory Visit: Payer: Self-pay

## 2019-06-20 ENCOUNTER — Encounter (HOSPITAL_COMMUNITY): Payer: Self-pay

## 2019-06-20 ENCOUNTER — Emergency Department (HOSPITAL_COMMUNITY)
Admission: EM | Admit: 2019-06-20 | Discharge: 2019-06-20 | Disposition: A | Discharge status: Home / Self Care | Payer: BLUE CROSS/BLUE SHIELD | Attending: Emergency Medicine | Admitting: Emergency Medicine

## 2019-06-20 DIAGNOSIS — T782XXA Anaphylactic shock, unspecified, initial encounter: Secondary | ICD-10-CM | POA: Diagnosis not present

## 2019-06-20 DIAGNOSIS — S80862A Insect bite (nonvenomous), left lower leg, initial encounter: Secondary | ICD-10-CM | POA: Diagnosis not present

## 2019-06-20 DIAGNOSIS — Y929 Unspecified place or not applicable: Secondary | ICD-10-CM | POA: Insufficient documentation

## 2019-06-20 DIAGNOSIS — Y999 Unspecified external cause status: Secondary | ICD-10-CM | POA: Insufficient documentation

## 2019-06-20 DIAGNOSIS — Z87891 Personal history of nicotine dependence: Secondary | ICD-10-CM | POA: Diagnosis not present

## 2019-06-20 DIAGNOSIS — J45909 Unspecified asthma, uncomplicated: Secondary | ICD-10-CM | POA: Diagnosis not present

## 2019-06-20 DIAGNOSIS — W57XXXA Bitten or stung by nonvenomous insect and other nonvenomous arthropods, initial encounter: Secondary | ICD-10-CM | POA: Diagnosis not present

## 2019-06-20 DIAGNOSIS — L509 Urticaria, unspecified: Secondary | ICD-10-CM | POA: Diagnosis not present

## 2019-06-20 DIAGNOSIS — Y939 Activity, unspecified: Secondary | ICD-10-CM | POA: Diagnosis not present

## 2019-06-20 MED ORDER — ALBUTEROL SULFATE HFA 108 (90 BASE) MCG/ACT IN AERS
2.0000 | INHALATION_SPRAY | Freq: Once | RESPIRATORY_TRACT | Status: AC
  Administered 2019-06-20: 2 via RESPIRATORY_TRACT
  Filled 2019-06-20: qty 6.7

## 2019-06-20 MED ORDER — EPINEPHRINE 0.3 MG/0.3ML IJ SOAJ: 0.3000 mg | INTRAMUSCULAR | 2 refills | Status: DC | PRN

## 2019-06-20 MED ORDER — EPINEPHRINE 0.3 MG/0.3ML IJ SOAJ
0.3000 mg | Freq: Once | INTRAMUSCULAR | Status: AC
  Administered 2019-06-20: 0.3 mg via INTRAMUSCULAR

## 2019-06-20 MED ORDER — PREDNISONE 20 MG PO TABS: 60.0000 mg | ORAL_TABLET | Freq: Every day | ORAL | 0 refills | Status: DC

## 2019-06-20 MED ORDER — DEXAMETHASONE SODIUM PHOSPHATE 10 MG/ML IJ SOLN
10.0000 mg | Freq: Once | INTRAMUSCULAR | Status: AC
  Administered 2019-06-20: 10 mg via INTRAVENOUS
  Filled 2019-06-20: qty 1

## 2019-06-20 MED ORDER — FAMOTIDINE IN NACL 20-0.9 MG/50ML-% IV SOLN
20.0000 mg | Freq: Once | INTRAVENOUS | Status: AC
  Administered 2019-06-20: 20 mg via INTRAVENOUS
  Filled 2019-06-20: qty 50

## 2019-06-20 NOTE — ED Provider Notes (Signed)
[facility]   CSN: [id number] Arrival date & time: 06/20/19  1629     History   Chief Complaint Chief Complaint  Patient presents with  . Allergic Reaction    [facility] Ped[facility]amed is a 24 y.o. male.  He has a significant history of asthma.  He is complaining of an allergic reaction from a bee sting that occurred just about an hour prior to arrival.  He was stung on the left lower leg x1 and then experienced hives and feeling hot all over and started swelling around his lips and face.  He took some Benadryl but the symptoms continue to worsen and so he presented to the emergency department.  He is never had an allergic reaction like this before.  He does feel little bit of shortness of breath.  Denies any syncope.  He did have some nausea and vomiting.     The history is provided by the patient.  Allergic Reaction Presenting symptoms: itching, rash, swelling and wheezing   Severity:  Severe Duration:  1 hour Prior allergic episodes:  No prior episodes Context: insect bite/sting   Relieved by:  Nothing Worsened by:  Nothing Ineffective treatments:  Antihistamines   Past Medical History:  Diagnosis Date  . ADD (attention deficit disorder)   . Asthma     Patient Active Problem List   Diagnosis Date Noted  . Asthma exacerbation 06/01/2019  . Polycythemia 06/01/2019  . Sinus tachycardia 06/01/2019  . Cough variant asthma 12/03/2018  . ADD (attention deficit disorder) 03/28/2015    No past surgical history on file.      Home Medications    Prior to Admission medications   Medication Sig Start Date End Date Taking? Authorizing Provider  albuterol (VENTOLIN HFA) 108 (90 Base) MCG/ACT inhaler Inhale 2 puffs into the lungs every 4 (four) hours as needed for wheezing or shortness of breath. 05/02/19   Roxy HorsemanBrowning, Robert, PA-C  cephALEXin (KEFLEX) 500 MG capsule Take 1 capsule (500 mg total) by mouth 3 (three) times  daily. Patient not taking: Reported on 05/02/2019 10/12/13   Reuben LikesKeller, David C, MD  HYDROcodone-acetaminophen (NORCO/VICODIN) 5-325 MG per tablet 1 to 2 tabs every 4 to 6 hours as needed for pain. Patient not taking: Reported on 05/02/2019 10/12/13   Reuben LikesKeller, David C, MD  Mometasone Furoate [facility](ASMANEX HFA) 100 MCG/ACT AERO Inhale 2 puffs into the lungs 2 (two) times daily. Patient not taking: Reported on 01/14/2019 12/02/18   Nyoka CowdenWert, Gayna Braddy B, MD  predniSONE (DELTASONE) 20 MG tablet Take 2 tablets (40 mg total) by mouth daily. Take 40 mg by mouth daily for 3 days, then 20mg  by mouth daily for 3 days, then 10mg  daily for 3 days 05/02/19   Roxy HorsemanBrowning, Robert, PA-C    Family History Family History  Problem Relation Age of Onset  . Diabetes Maternal Grandmother     Social History Social History   Tobacco Use  . Smoking status: Former Smoker    Packs/day: 0.25    Years: 6.00    Pack years: 1.50    Quit date: 08/31/2018    Years since quitting: 0.8  . Smokeless tobacco: Never Used  Substance Use Topics  . Alcohol use: No    Alcohol/week: 0.0 standard drinks  . Drug use: No     Allergies   Patient has no known allergies.   Review of Systems Review of Systems  Constitutional: Negative for fever.  HENT: Negative for sore throat.  Eyes: Negative for visual disturbance.  Respiratory: Positive for shortness of breath and wheezing.   Cardiovascular: Negative for chest pain.  Gastrointestinal: Positive for nausea and vomiting. Negative for abdominal pain.  Genitourinary: Negative for dysuria.  Musculoskeletal: Negative for neck pain.  Skin: Positive for itching and rash.  Neurological: Negative for headaches.     Physical Exam Updated Vital Signs BP 136/90 (BP Location: Right Arm)   Pulse (!) 102   Temp 97.8 F (36.6 C) (Oral)   Resp (!) 21   Ht 5\' 9"  (1.753 m)   Wt 90.7 kg   SpO2 94%   BMI 29.53 kg/m   Physical Exam Vitals signs and nursing note reviewed.  Constitutional:       Appearance: He is well-developed.  HENT:     Head: Normocephalic and atraumatic.     Comments: Patient has some mild edema throughout his face and lips.  There is no stridor no trismus. Eyes:     Conjunctiva/sclera: Conjunctivae normal.  Neck:     Musculoskeletal: Neck supple.  Cardiovascular:     Rate and Rhythm: Regular rhythm. Tachycardia present.     Heart sounds: No murmur.  Pulmonary:     Effort: Pulmonary effort is normal. No respiratory distress.     Comments: Patient has some coarse expiratory breath sounds. Abdominal:     Palpations: Abdomen is soft.     Tenderness: There is no abdominal tenderness.  Musculoskeletal: Normal range of motion.  Skin:    General: Skin is warm and dry.     Capillary Refill: Capillary refill takes less than 2 seconds.     Findings: Erythema present.     Comments: Patient has diffuse erythema over his skin and a few areas of hives.  Neurological:     General: No focal deficit present.     Mental Status: He is alert and oriented to person, place, and time.      ED Treatments / Results  Labs (all labs ordered are listed, but only abnormal results are displayed) Labs Reviewed - No data to display  EKG EKG Interpretation  Date/Time:  Saturday June 20 2019 16:42:59 EDT Ventricular Rate:  110 PR Interval:    QRS Duration: 96 QT Interval:  332 QTC Calculation: 450 R Axis:   71 Text Interpretation:  Sinus tachycardia similar to prior 6/20 Confirmed by Aletta Edouard [phone]) on 06/20/2019 4:47:42 PM   Radiology No results found.  Procedures .Critical Care Performed by: Hayden Rasmussen, MD Authorized by: Hayden Rasmussen, MD   Critical care provider statement:    Critical care time (minutes):  45   Critical care time was exclusive of:  Separately billable procedures and treating other patients   Critical care was necessary to treat or prevent imminent or life-threatening deterioration of the following conditions: anaphylatoid  reaction.   Critical care was time spent personally by me on the following activities:  Evaluation of patient's response to treatment, examination of patient, ordering and performing treatments and interventions, pulse oximetry, re-evaluation of patient's condition, obtaining history from patient or surrogate, review of old charts and development of treatment plan with patient or surrogate   I assumed direction of critical care for this patient from another provider in my specialty: no     (including critical care time)  Medications Ordered in ED Medications  EPINEPHrine (EPI-PEN) injection 0.3 mg (has no administration in time range)  dexamethasone (DECADRON) injection 10 mg (has no administration in time range)  famotidine (PEPCID)  IVPB 20 mg premix (has no administration in time range)  albuterol (VENTOLIN HFA) 108 (90 Base) MCG/ACT inhaler 2 puff (has no administration in time range)     Initial Impression / Assessment and Plan / ED Course  I have reviewed the triage vital signs and the nursing notes.  Pertinent labs & imaging results that were available during my care of the patient were reviewed by me and considered in my medical decision making (see chart for details).  Clinical Course as of Jun 19 2056  Sat Jun 20, 2019  1652 Patient here with likely allergic reaction.  Differential includes anaphylaxis, anaphylactoid reaction, allergic reaction.  He received subcu epi on arrival as he was exhibiting some significant symptoms and is currently on a cardiac monitor.  He is getting some IV steroids and Pepcid.  He had already had Benadryl prior to arrival.  Will observe.   [MB]  1836 Reevaluated patient he remains comfortable with no signs of respiratory distress.  Currently he is sleeping.   [MB]  2008 Reevaluated patient.  His symptoms are much improved.  He has been observed here for almost 4 hours with no recurrence.  I feel it safe to discharge him and we will send him home with  an EpiPen and a brief course of some prednisone.   [MB]    Clinical Course User Index [MB] Terrilee FilesButler, Deangela Randleman C, MD        Final Clinical Impressions(s) / ED Diagnoses   Final diagnoses:  Anaphylaxis, initial encounter    ED Discharge Orders         Ordered    EPINEPHrine 0.3 mg/0.3 mL IJ SOAJ injection  As needed     06/20/19 2010    predniSONE (DELTASONE) 20 MG tablet  Daily     06/20/19 2010           Terrilee FilesButler, Airiana Elman C, MD 06/20/19 [phone]

## 2019-06-20 NOTE — ED Triage Notes (Signed)
Pt reports being stung by bee about 1hr ago. Pt presents with diffuse, generalized redness, hives and edema including periorbital, lips, tounge and throat. Pt is wheezing and c/o of ShoB.

## 2019-06-20 NOTE — ED Triage Notes (Signed)
He states he was "stung by something" at home. He arrives with widespread urticaria and beefy deep red discoloration. He had taken three Benadryl tablets before arrival. Our team of nurses meets him in [facility] upon his arrival.

## 2019-06-20 NOTE — Discharge Instructions (Addendum)
You were seen in the emergency department for a significant allergic reaction after a bee sting.  We are sending you home with prescriptions for prednisone to take for 4 more days and you should still continue to use Benadryl 1 to 2 tablets every 4-6 hours as needed.  Please stay well-hydrated.  We are also sending home with a prescription for an EpiPen if you have another serious reaction.  If you use the EpiPen it is still very important that you come to the emergency department for evaluation.  Please return if any concerns.

## 2019-08-10 ENCOUNTER — Other Ambulatory Visit: Payer: Self-pay

## 2019-08-10 ENCOUNTER — Ambulatory Visit: Payer: BLUE CROSS/BLUE SHIELD | Admitting: Family Medicine

## 2019-11-04 IMAGING — CR DG CHEST 2V
2 series · 2 of 2 positions shown · non-contrast
Comparison: None.

CLINICAL DATA: Cough, current smoker

EXAM:
CHEST - 2 VIEW

[w chest pa]
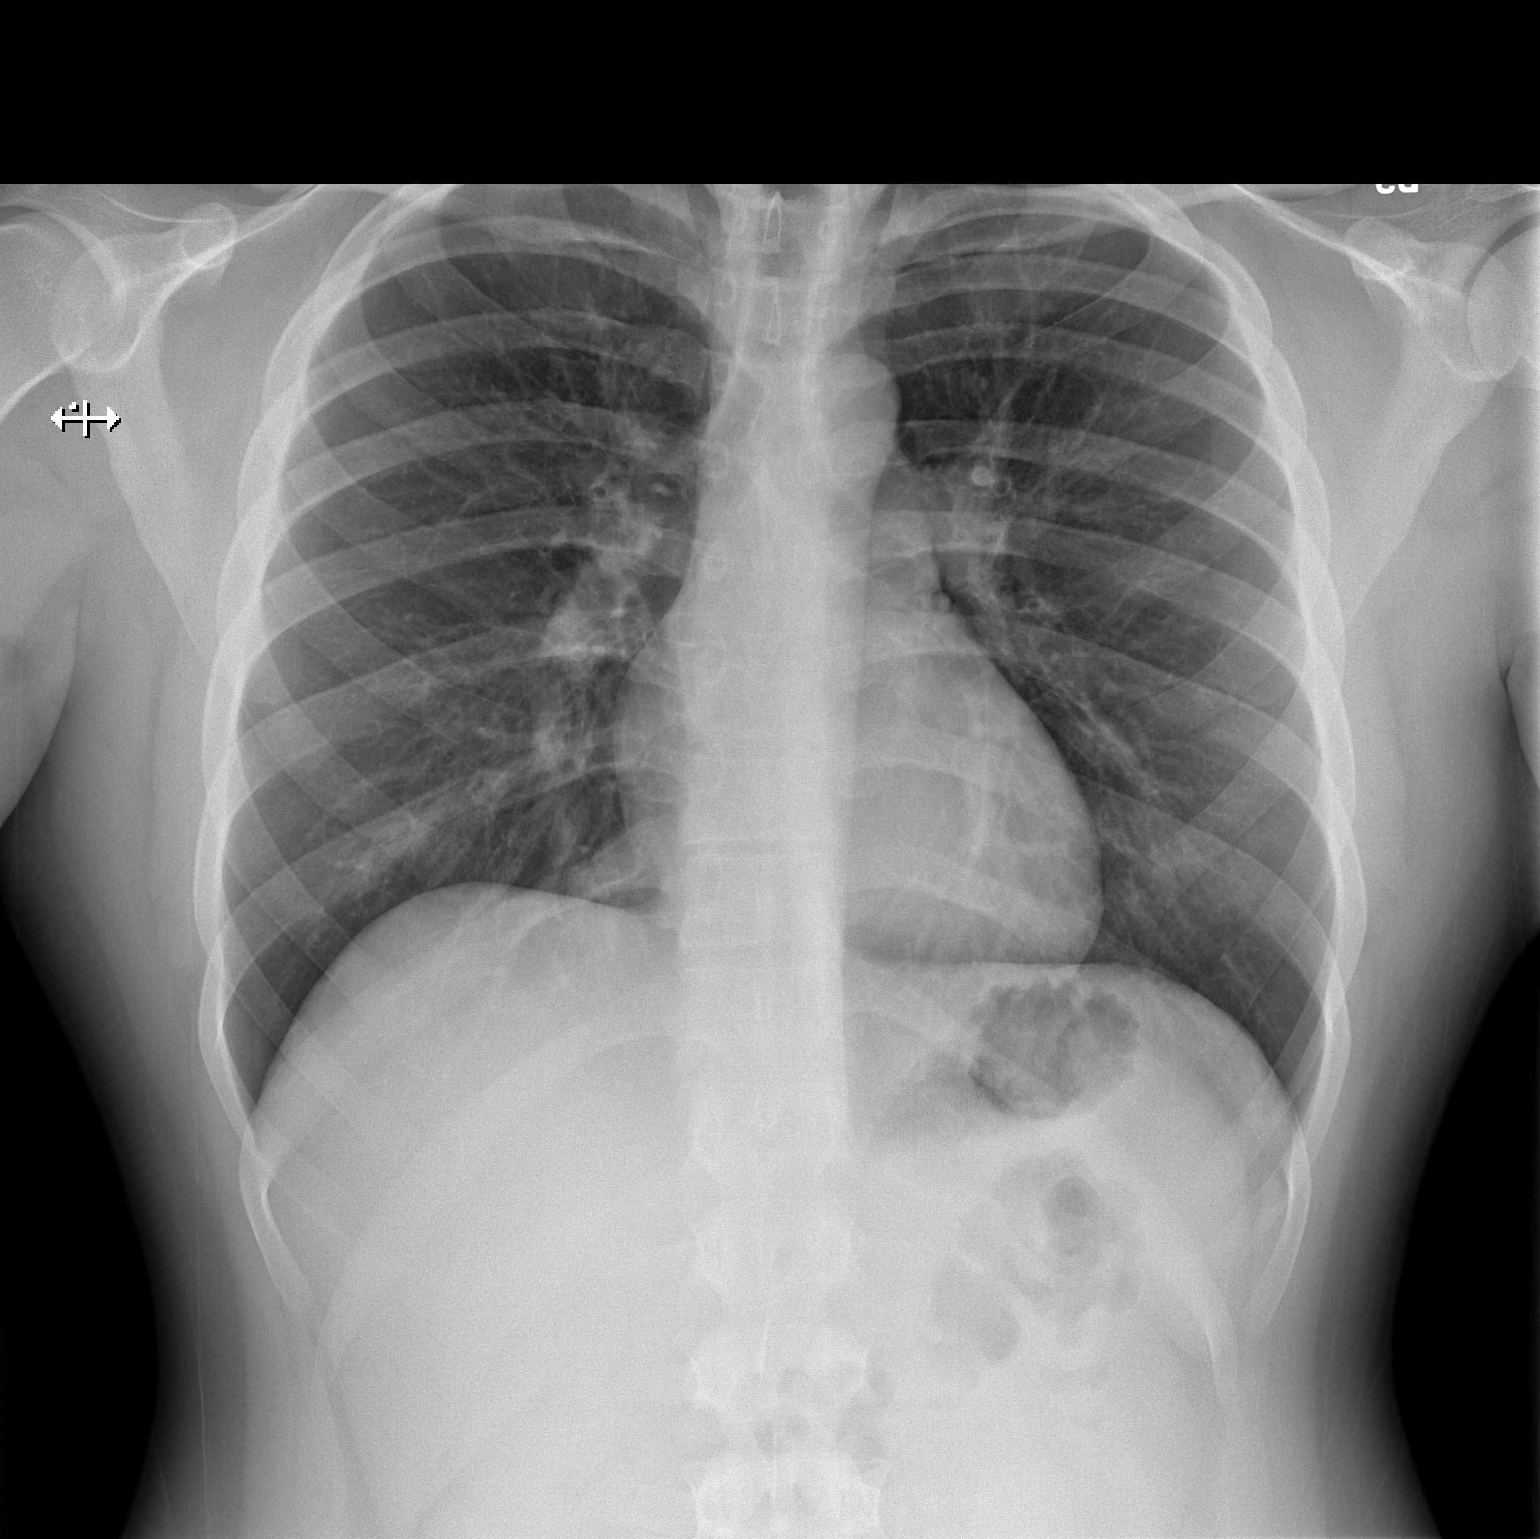

[w chest lat]
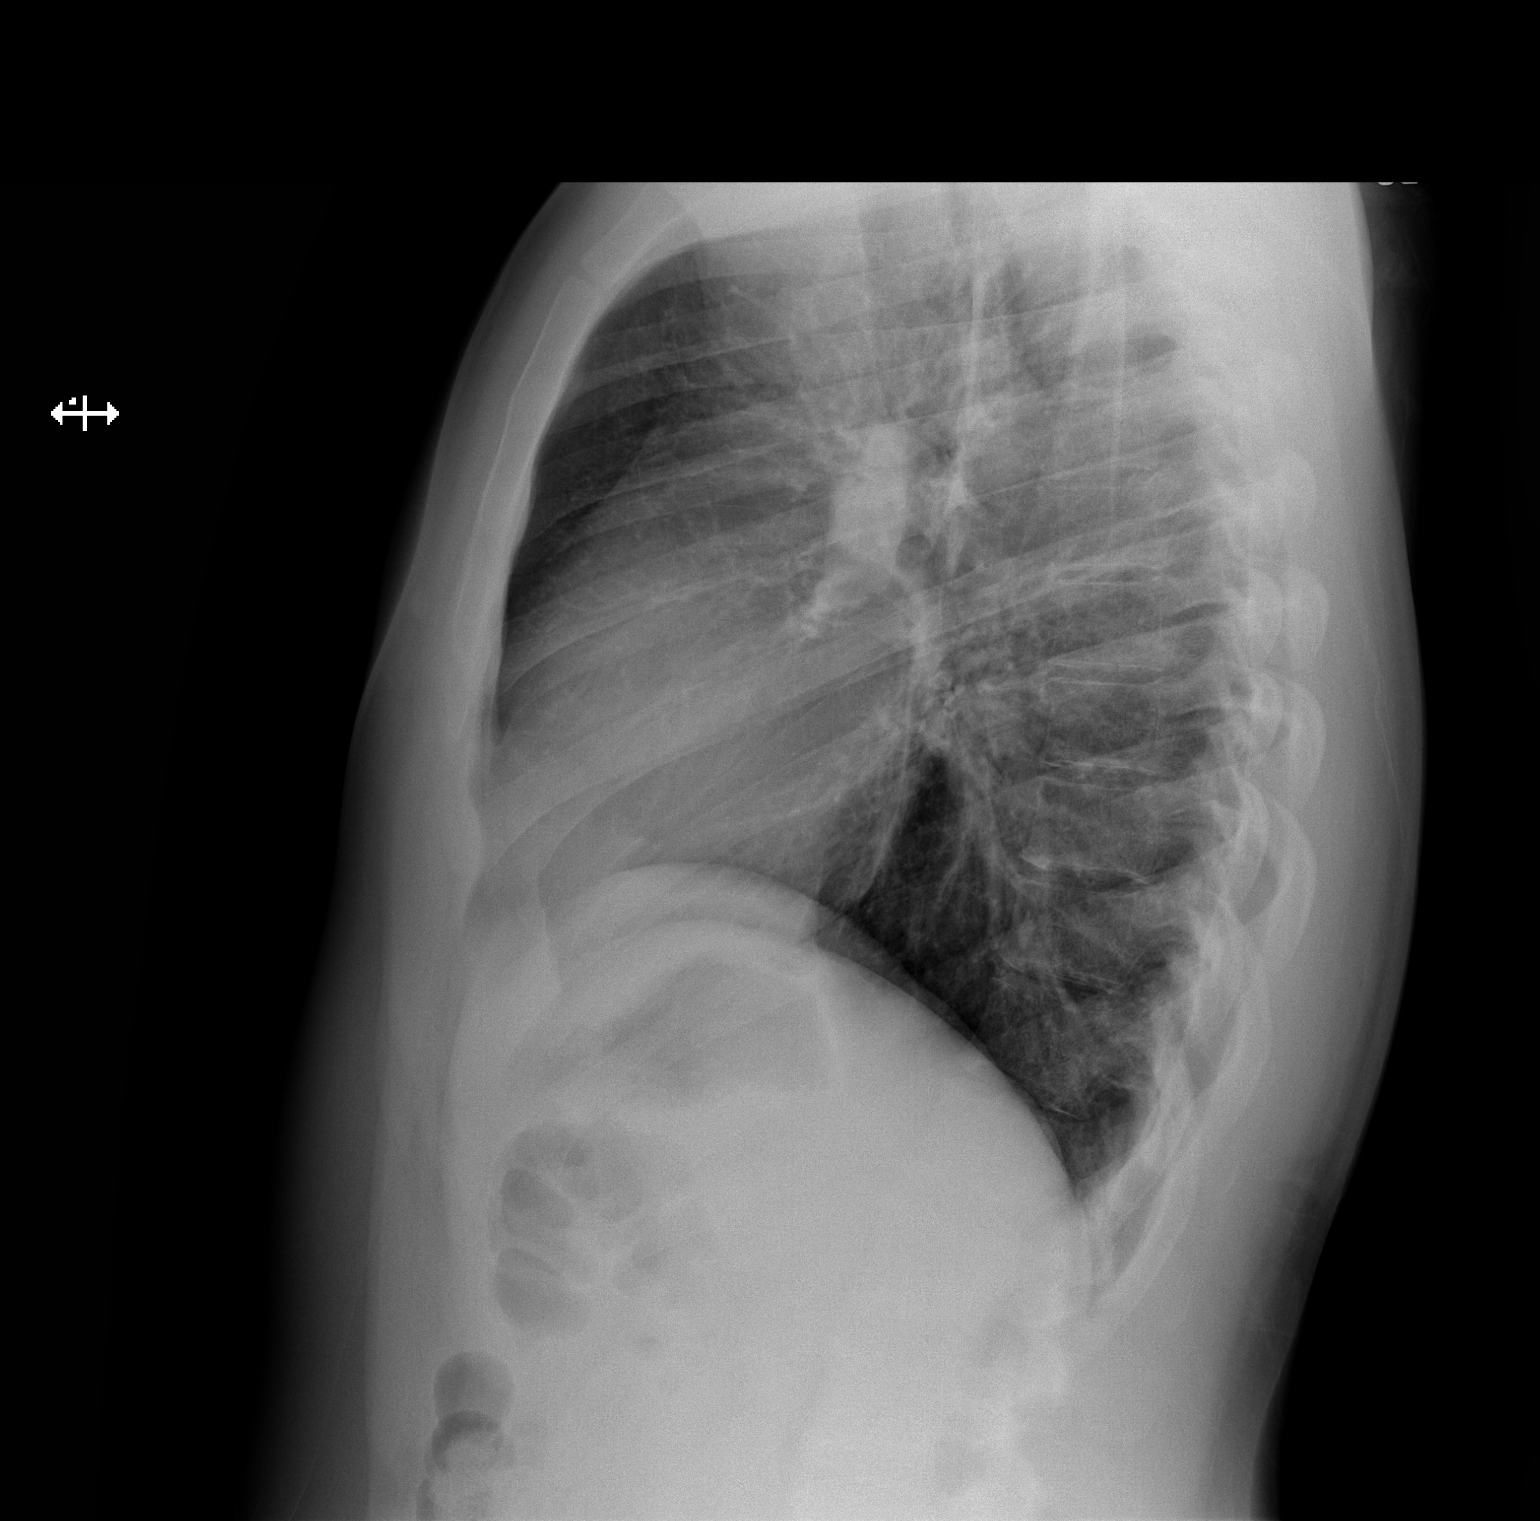

[2 of 2 positions shown; findings below may reference images not displayed]

FINDINGS: Normal heart size. Normal mediastinal contour. No pneumothorax. No
pleural effusion. Lungs appear clear, with no acute consolidative
airspace disease and no pulmonary edema.
IMPRESSION: No active cardiopulmonary disease.

## 2020-03-24 IMAGING — CR DG CHEST 2 VIEW
2 series · 2 of 2 positions shown · non-contrast
Comparison: 10/05/2018

CLINICAL DATA: Cough

EXAM:
CHEST - 2 VIEW

[w chest pa]
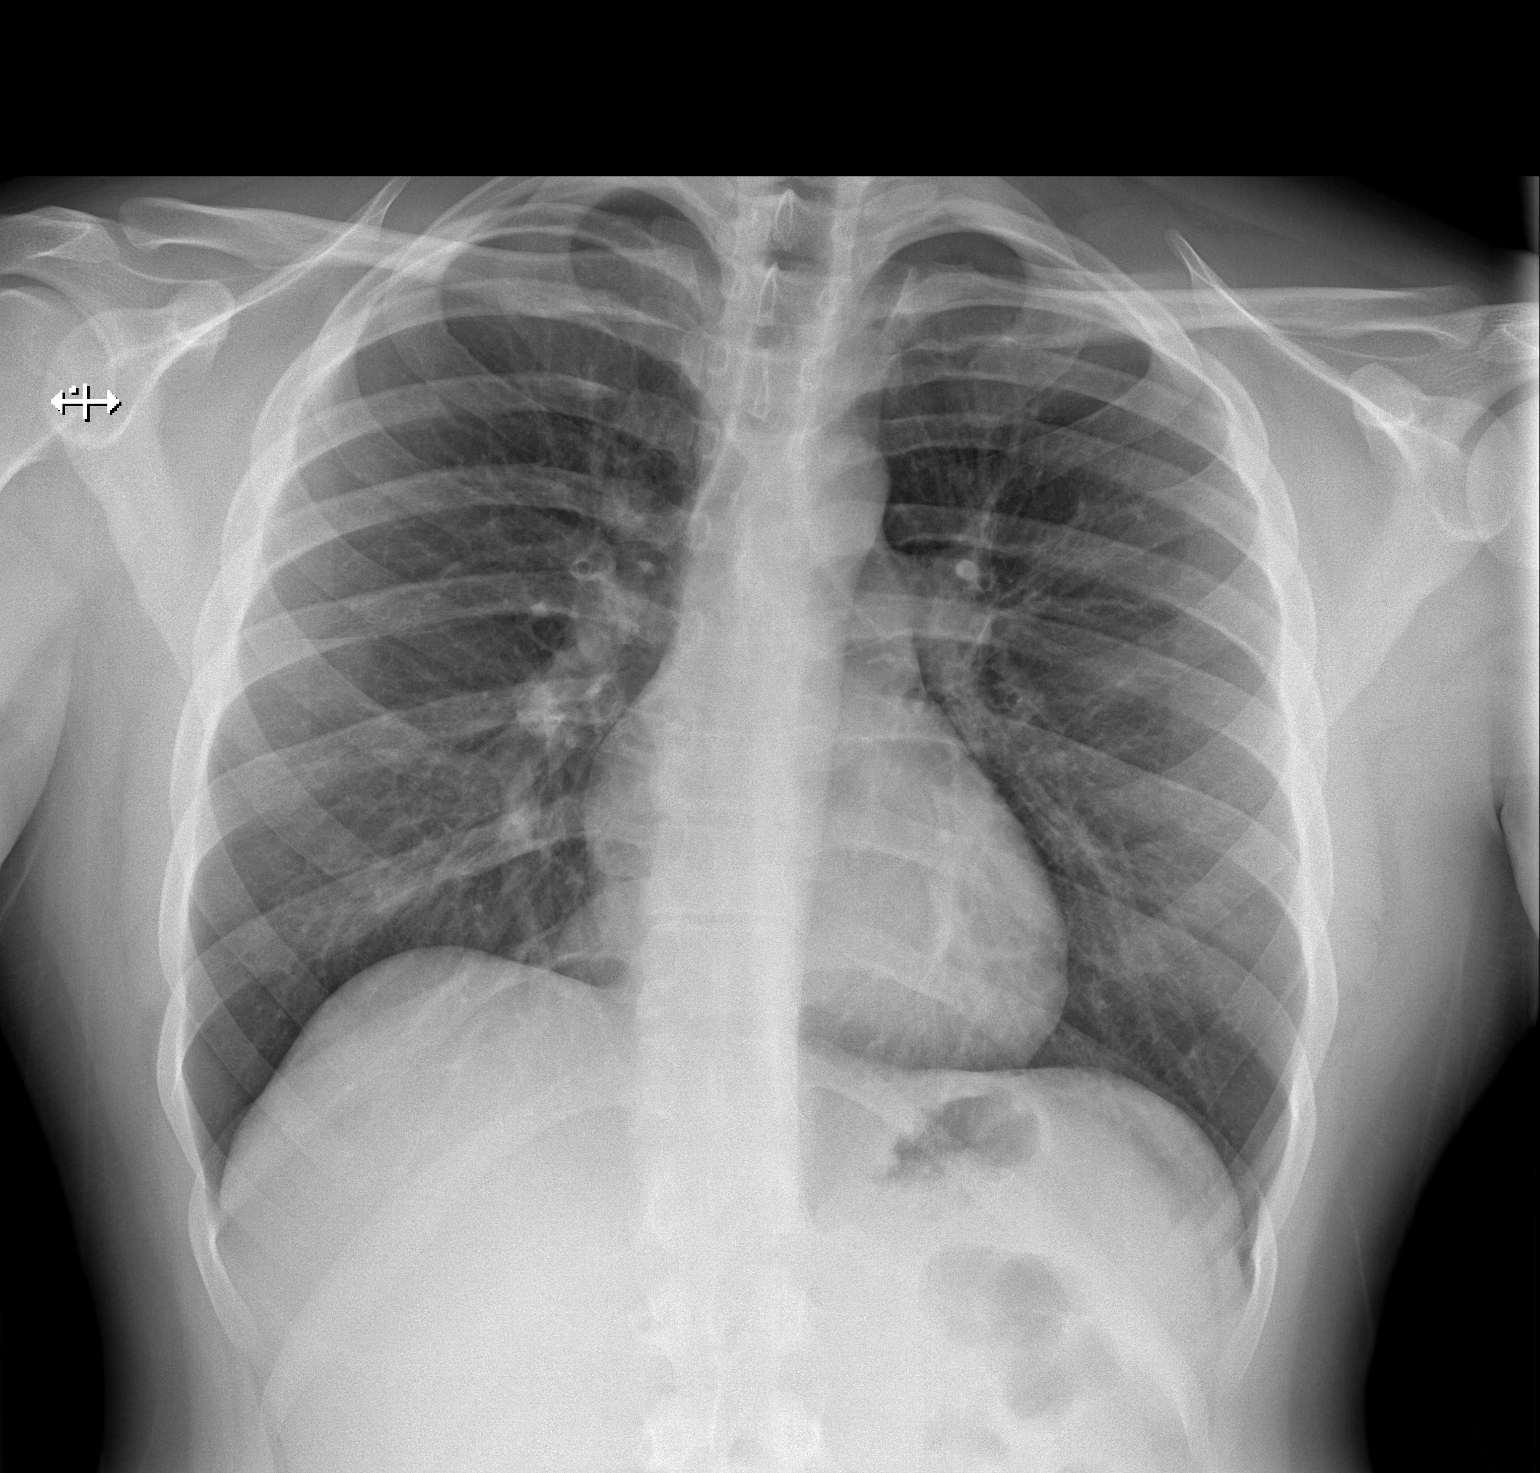

[w chest lat]
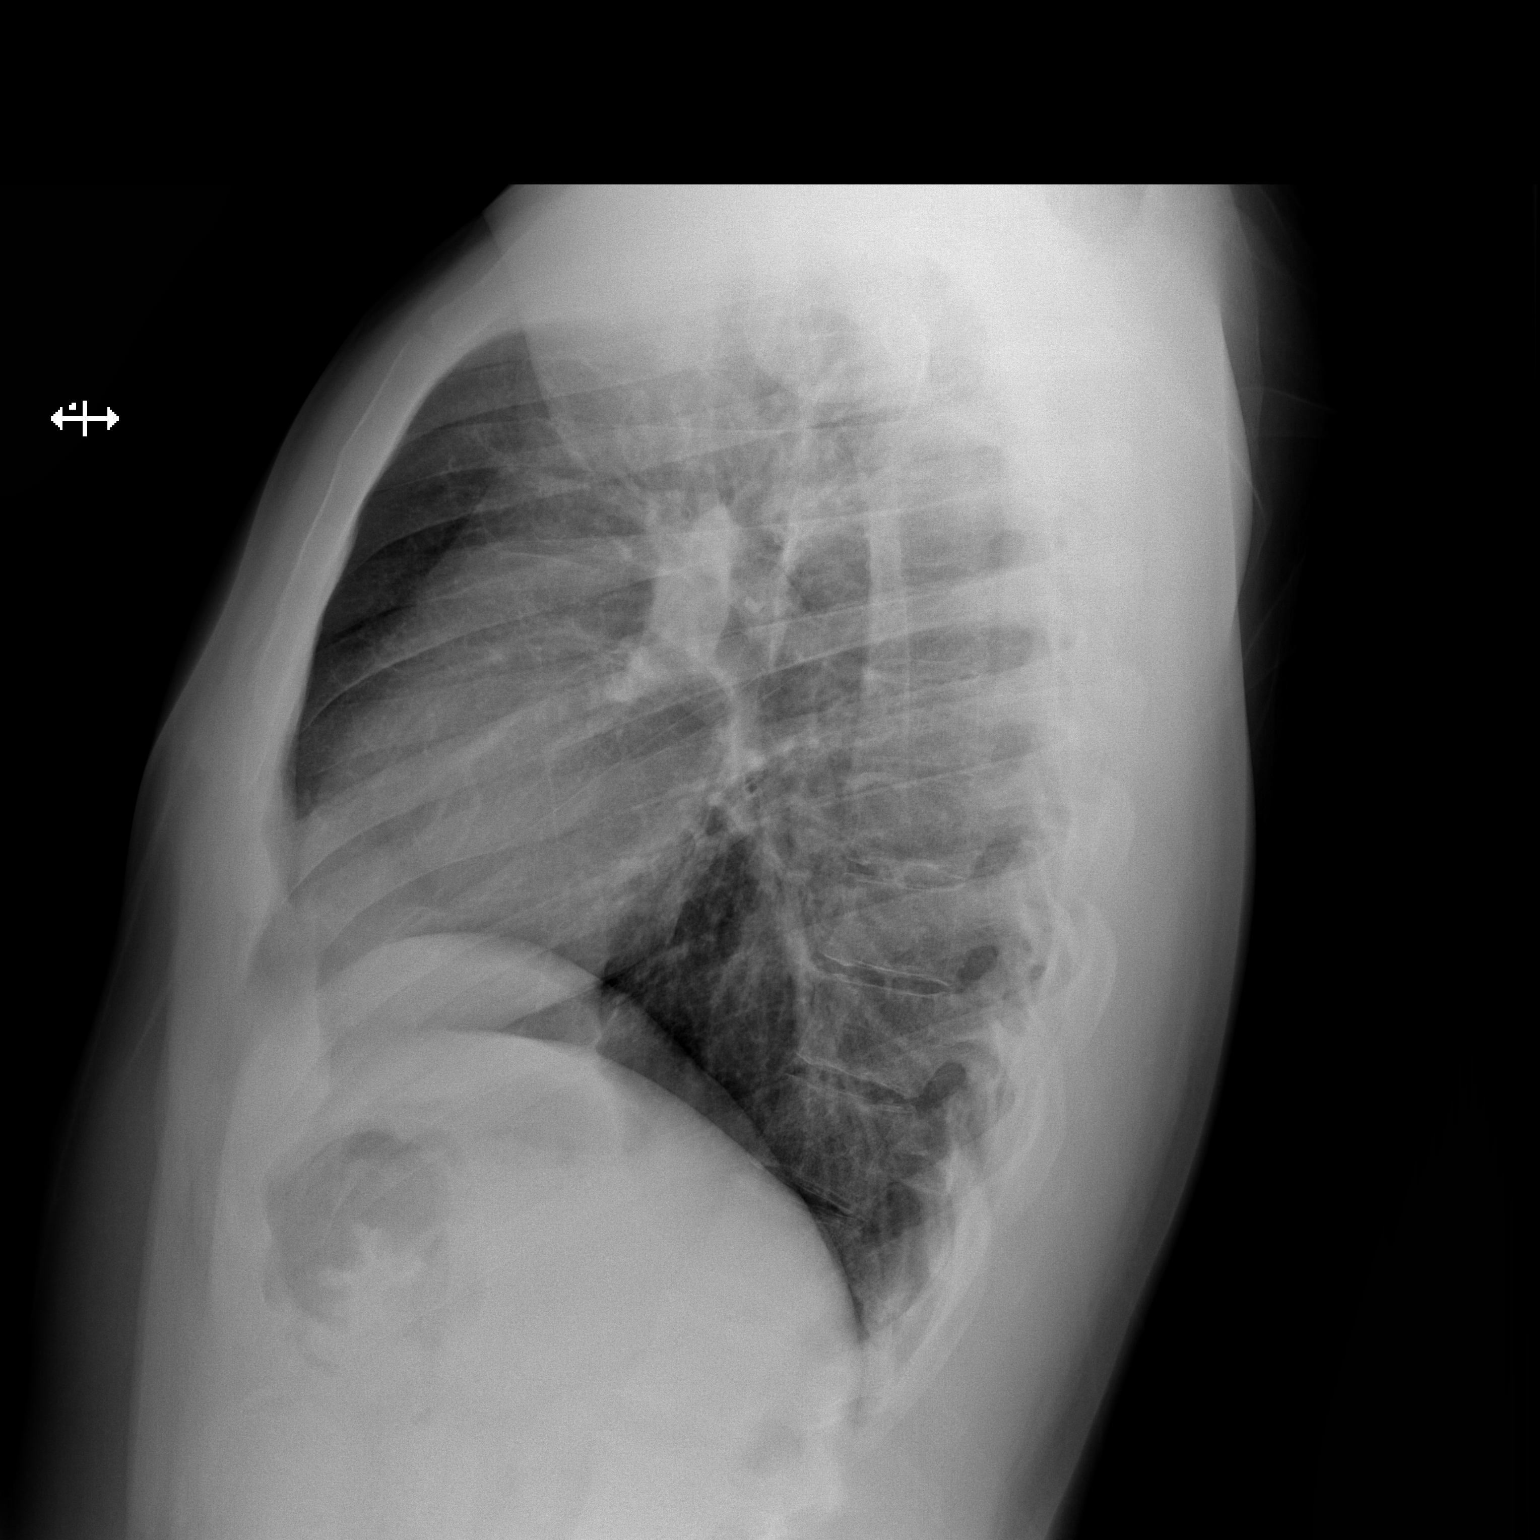

[2 of 2 positions shown; findings below may reference images not displayed]

FINDINGS: The heart size and mediastinal contours are within normal limits.
Both lungs are clear. The visualized skeletal structures are
unremarkable.
IMPRESSION: No active cardiopulmonary disease.

## 2020-04-10 IMAGING — CR DG CHEST 2 VIEW
2 series · 2 of 2 positions shown · non-contrast
Comparison: None.

CLINICAL DATA: Cough, wheezing for several weeks

EXAM:
CHEST - 2 VIEW

[w chest pa]
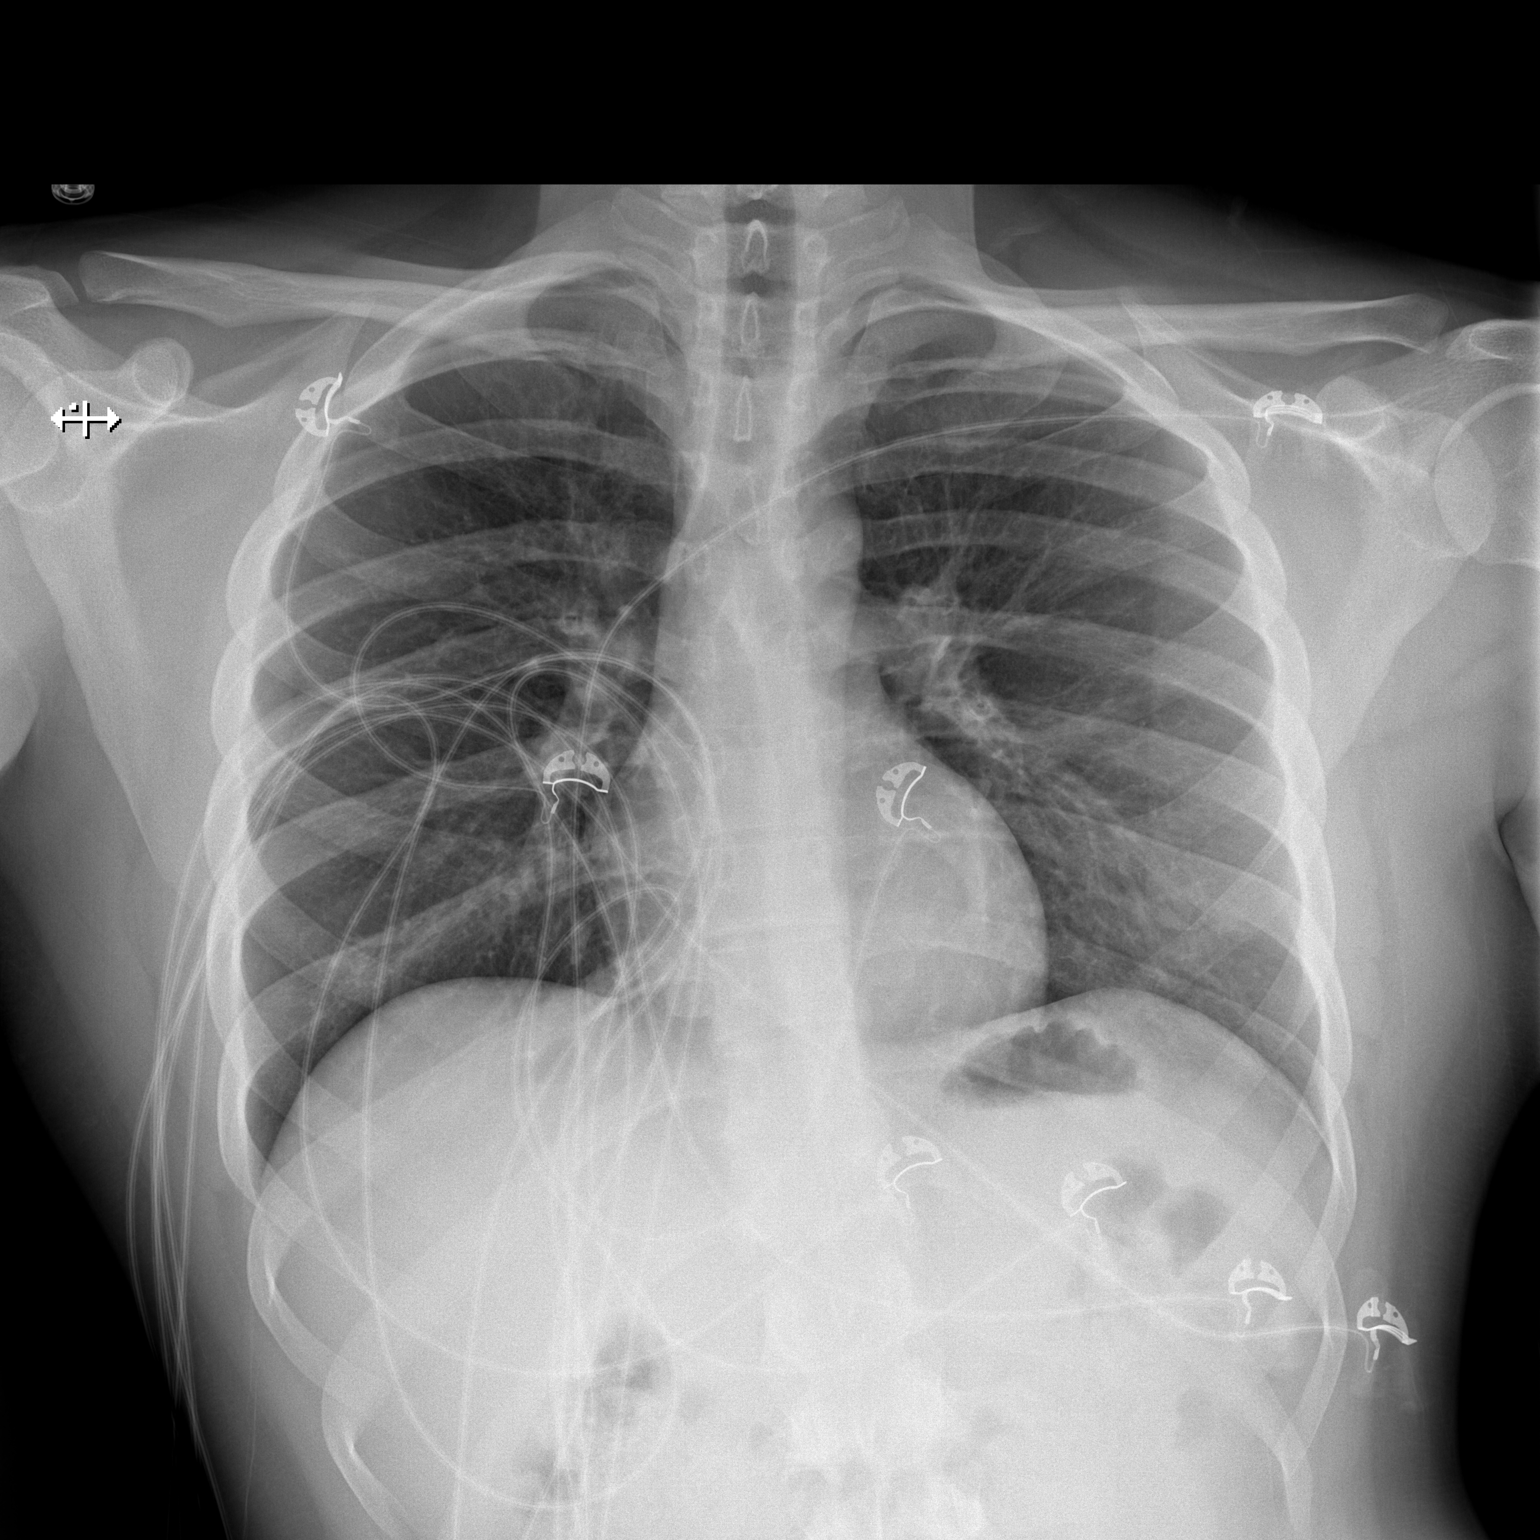

[w chest lat]
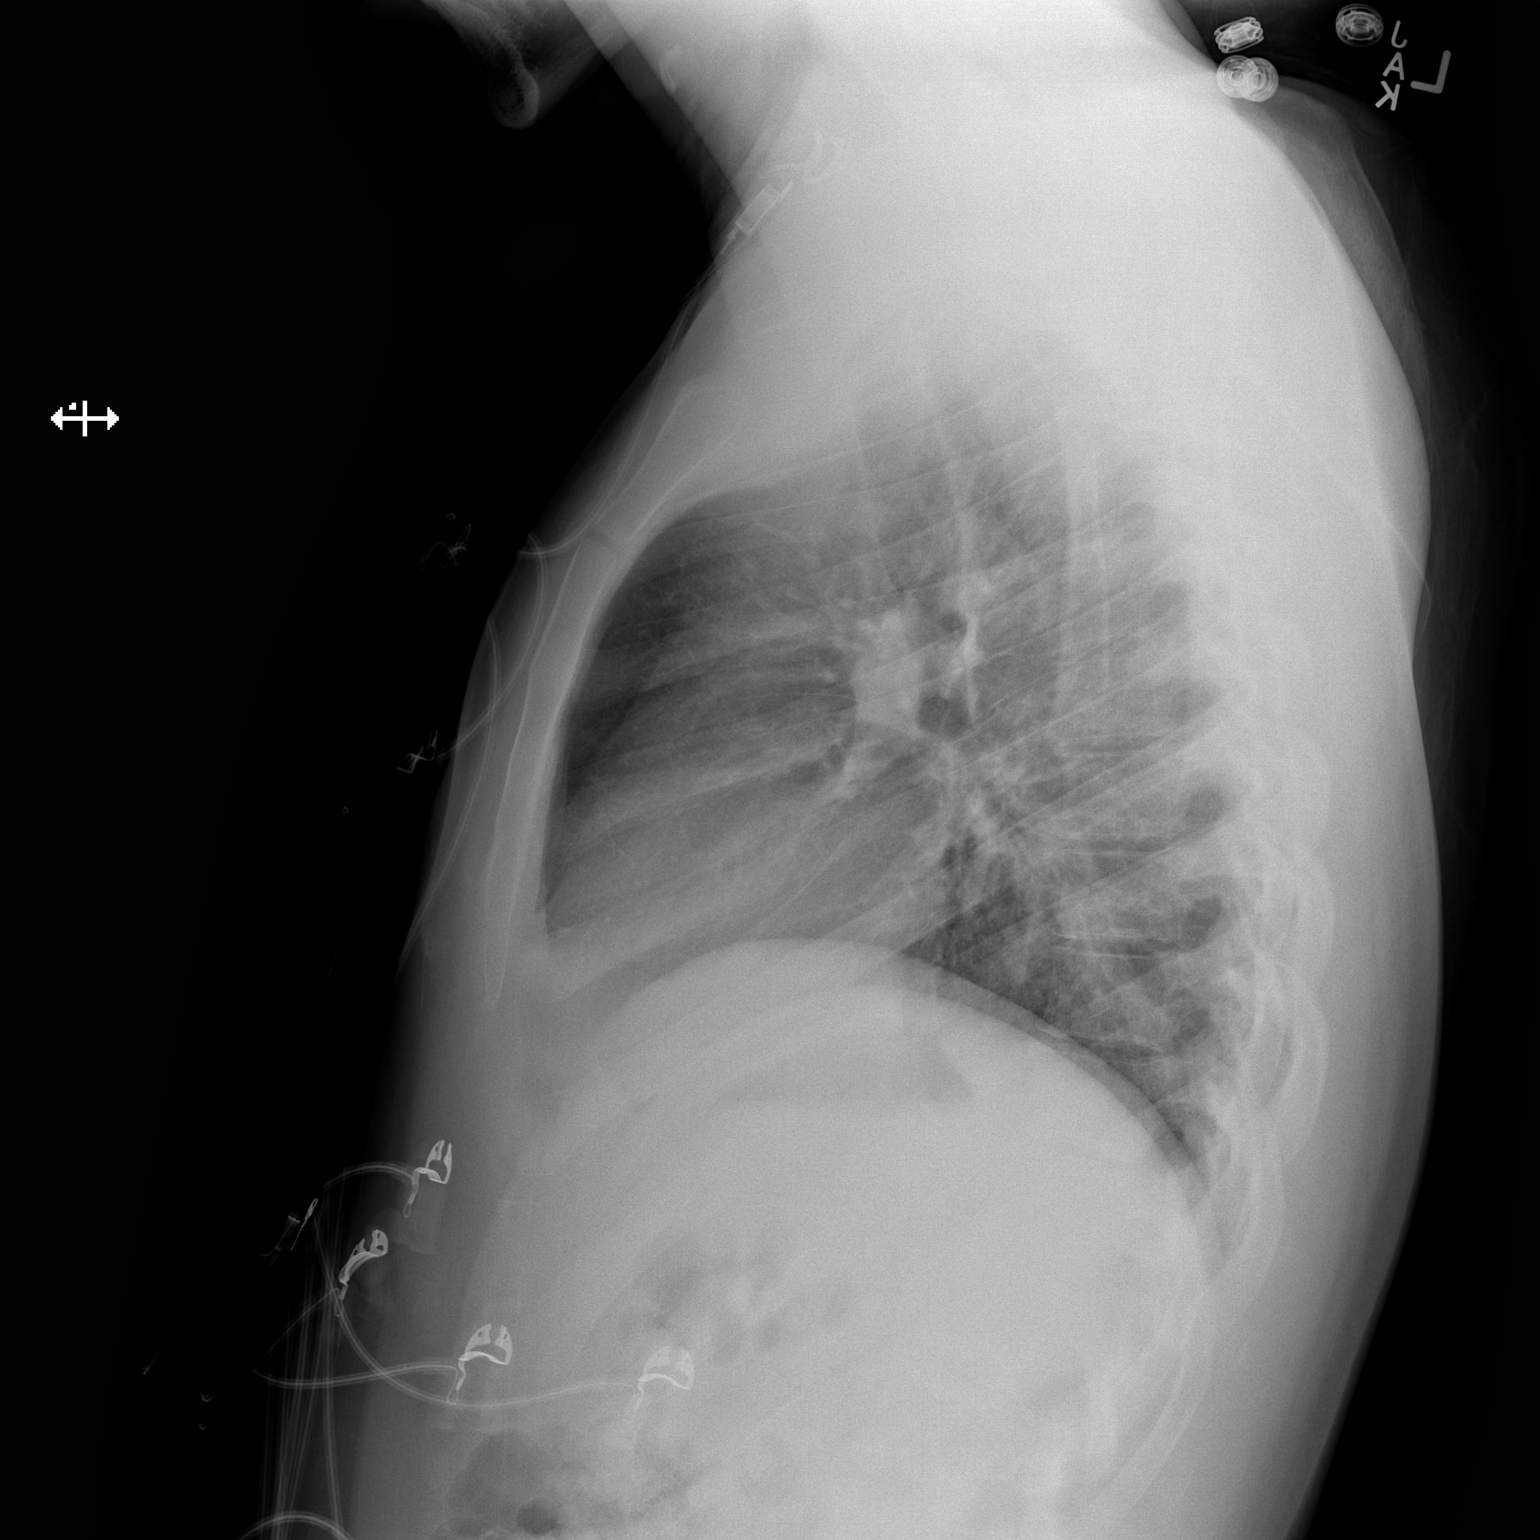

[2 of 2 positions shown; findings below may reference images not displayed]

FINDINGS: The heart size and mediastinal contours are within normal limits.
Both lungs are clear. The visualized skeletal structures are
unremarkable.
IMPRESSION: No active cardiopulmonary disease.

## 2020-04-27 ENCOUNTER — Other Ambulatory Visit: Payer: Self-pay

## 2020-04-27 ENCOUNTER — Telehealth: Payer: Self-pay | Admitting: Registered Nurse

## 2020-04-27 ENCOUNTER — Ambulatory Visit (INDEPENDENT_AMBULATORY_CARE_PROVIDER_SITE_OTHER): Payer: BC Managed Care – PPO | Admitting: Registered Nurse

## 2020-04-27 ENCOUNTER — Encounter: Payer: Self-pay | Admitting: Registered Nurse

## 2020-04-27 VITALS — BP 121/77 | HR 61 | Temp 97.8°F | Resp 16 | Ht 69.0 in | Wt 194.2 lb

## 2020-04-27 DIAGNOSIS — Z13220 Encounter for screening for lipoid disorders: Secondary | ICD-10-CM | POA: Diagnosis not present

## 2020-04-27 DIAGNOSIS — Z1329 Encounter for screening for other suspected endocrine disorder: Secondary | ICD-10-CM

## 2020-04-27 DIAGNOSIS — Z1159 Encounter for screening for other viral diseases: Secondary | ICD-10-CM

## 2020-04-27 DIAGNOSIS — Z130 Encounter for screening for diseases of the blood and blood-forming organs and certain disorders involving the immune mechanism: Secondary | ICD-10-CM

## 2020-04-27 DIAGNOSIS — F988 Other specified behavioral and emotional disorders with onset usually occurring in childhood and adolescence: Secondary | ICD-10-CM

## 2020-04-27 DIAGNOSIS — Z13228 Encounter for screening for other metabolic disorders: Secondary | ICD-10-CM | POA: Diagnosis not present

## 2020-04-27 MED ORDER — AMPHETAMINE-DEXTROAMPHETAMINE 20 MG PO TABS: 20.0000 mg | ORAL_TABLET | Freq: Two times a day (BID) | ORAL | 0 refills | Status: DC

## 2020-04-27 NOTE — Progress Notes (Signed)
Established Patient Office Visit  Subjective:  Patient ID: Mitchell Nelson, male    DOB: Jul 15, 1995  Age: 25 y.o. MRN: [id number]  CC:  Chief Complaint  Patient presents with  . Medication Refill    patient needs a medication refill for adderall states he has not took medication since [place]      HPI Mitchell Nelson presents for visit to establish care and med refills  Has been on adderall 20mg  PO bid in the past with good effect. He reports that [place] , [place] adderall was not available and he was given Ritalin, which he did not like.   Reports ongoing sxs of ADD as previously described- trouble focusing, easily distracted, trouble finishing tasks, etc.  No other concerns at this time  Thinking of starting grad school soon. Has received both COVID vaccinations.  Past Medical History:  Diagnosis Date  . ADD (attention deficit disorder)   . Asthma     History reviewed. No pertinent surgical history.  Family History  Problem Relation Age of Onset  . Diabetes Maternal Grandmother     Social History   Socioeconomic History  . Marital status: Single    Spouse name: Not on file  . Number of children: Not on file  . Years of education: Not on file  . Highest education level: Not on file  Occupational History  . Not on file  Tobacco Use  . Smoking status: Former Smoker    Packs/day: 0.25    Years: 6.00    Pack years: 1.50    Quit date: 08/31/2018    Years since quitting: 1.6  . Smokeless tobacco: Current User    Types: Chew  Substance and Sexual Activity  . Alcohol use: No    Alcohol/week: 0.0 standard drinks  . Drug use: No  . Sexual activity: Yes  Other Topics Concern  . Not on file  Social History Narrative          Social Determinants of Health   Financial Resource Strain:   . Difficulty of Paying Living  Expenses:   Food Insecurity:   . Worried About 10/31/2018 in the Last Year:   . Programme researcher, broadcasting/film/video in the Last Year:   Transportation Needs:   . Barista (Medical):   Freight forwarder Lack of Transportation (Non-Medical):   Physical Activity:   . Days of Exercise per Week:   . Minutes of Exercise per Session:   Stress:   . Feeling of Stress :   Social Connections:   . Frequency of Communication with Friends and Family:   . Frequency of Social Gatherings with Friends and Family:   . Attends Religious Services:   . Active Member of Clubs or Organizations:   . Attends Marland [employer] Meetings:   Banker Marital Status:   Intimate Partner Violence:   . Fear of Current or Ex-Partner:   . Emotionally Abused:   Marland Kitchen Physically Abused:   . Sexually Abused:     Outpatient Medications Prior to Visit  Medication Sig Dispense Refill  . albuterol (VENTOLIN HFA) 108 (90 Base) MCG/ACT inhaler Inhale 2 puffs into the lungs every 4 (four) hours as needed for wheezing or shortness of breath. (Patient not taking: Reported on 04/27/2020) 1 Inhaler 3  . cephALEXin (KEFLEX) 500 MG capsule Take 1 capsule (500  mg total) by mouth 3 (three) times daily. (Patient not taking: Reported on 05/02/2019) 30 capsule 0  . EPINEPHrine 0.3 mg/0.3 mL IJ SOAJ injection Inject 0.3 mLs (0.3 mg total) into the muscle as needed for anaphylaxis. (Patient not taking: Reported on 04/27/2020) 1 each 2  . HYDROcodone-acetaminophen (NORCO/VICODIN) 5-325 MG per tablet 1 to 2 tabs every 4 to 6 hours as needed for pain. (Patient not taking: Reported on 05/02/2019) 20 tablet 0  . Melatonin 5 MG TABS Take 5 mg by mouth at bedtime.    . Mometasone Furoate (ASMANEX HFA) 100 MCG/ACT AERO Inhale 2 puffs into the lungs 2 (two) times daily. (Patient not taking: Reported on 01/14/2019) 1 Inhaler 11  . Multiple Vitamin (MULTIVITAMIN WITH MINERALS) TABS tablet Take 1 tablet by mouth daily.    . predniSONE (DELTASONE) 20 MG tablet Take 3 tablets (60  mg total) by mouth daily. (Patient not taking: Reported on 04/27/2020) 12 tablet 0   No facility-administered medications prior to visit.    No Known Allergies  ROS Review of Systems  Constitutional: Negative.   HENT: Negative.   Eyes: Negative.   Respiratory: Negative.   Cardiovascular: Negative.   Gastrointestinal: Negative.   Endocrine: Negative.   Genitourinary: Negative.   Musculoskeletal: Negative.   Skin: Negative.   Allergic/Immunologic: Negative.   Neurological: Negative.   Hematological: Negative.   Psychiatric/Behavioral: Negative.   All other systems reviewed and are negative.     Objective:    Physical Exam  Constitutional: He is oriented to person, place, and time. He appears well-developed and well-nourished.  Cardiovascular: Normal rate and regular rhythm.  Pulmonary/Chest: Effort normal. No respiratory distress.  Neurological: He is alert and oriented to person, place, and time.  Skin: Skin is warm and dry. No rash noted. No erythema. No pallor.  Psychiatric: He has a normal mood and affect. His behavior is normal. Judgment and thought content normal.  Nursing note and vitals reviewed.   BP 121/77   Pulse 61   Temp 97.8 F (36.6 C) (Temporal)   Resp 16   Ht 5\' 9"  (1.753 m)   Wt 194 lb 3.2 oz (88.1 kg)   SpO2 97%   BMI 28.68 kg/m  Wt Readings from Last 3 Encounters:  04/27/20 194 lb 3.2 oz (88.1 kg)  06/20/19 200 lb (90.7 kg)  06/01/19 200 lb (90.7 kg)     Health Maintenance Due  Topic Date Due  . HIV Screening  Never done  . COVID-19 Vaccine (1) Never done    There are no preventive care reminders to display for this patient.  No results found for: TSH Lab Results  Component Value Date   WBC 7.5 06/01/2019   HGB 17.1 (H) 06/01/2019   HCT 51.4 06/01/2019   MCV 92.1 06/01/2019   PLT 261 06/01/2019   Lab Results  Component Value Date   NA 138 06/01/2019   K 3.7 06/01/2019   CO2 25 06/01/2019   GLUCOSE 103 (H) 06/01/2019   BUN  9 06/01/2019   CREATININE 0.94 06/01/2019   CALCIUM 9.5 06/01/2019   ANIONGAP 11 06/01/2019   No results found for: CHOL No results found for: HDL No results found for: LDLCALC No results found for: TRIG No results found for: CHOLHDL No results found for: HGBA1C    Assessment & Plan:   Problem List Items Addressed This Visit      Other   ADD (attention deficit disorder)   Relevant Medications   amphetamine-dextroamphetamine (  ADDERALL) 20 MG tablet    Other Visit Diagnoses    Encounter for screening for other viral diseases    -  Primary   Relevant Orders   HIV antibody (with reflex)   Screening for endocrine, metabolic and immunity disorder       Relevant Orders   TSH   CBC with Differential   Comprehensive metabolic panel   Lipid screening       Relevant Orders   Lipid panel      Meds ordered this encounter  Medications  . amphetamine-dextroamphetamine (ADDERALL) 20 MG tablet    Sig: Take 1 tablet (20 mg total) by mouth 2 (two) times daily.    Dispense:  60 tablet    Refill:  0    Order Specific Question:   Supervising Provider    Answer:   Doristine Bosworth [id number]    Follow-up: No follow-ups on file.   PLAN  pdmp consulted  Restart adderall 20mg  PO bid  Return in 3 mo for follow up  Labs collected, will follow up as warranted.  Patient encouraged to call clinic with any questions, comments, or concerns.  , NP

## 2020-04-27 NOTE — Telephone Encounter (Signed)
Pt called from pharmacy/ he is waiting on Rx for adderall / please advise   He would like it sent to  [id number] [place], [place] - [address] AT [facility] OF [address] & [facility] [address] Phone:  [phone]  Fax:  [phone]

## 2020-04-27 NOTE — Patient Instructions (Signed)
° ° ° °  If you have lab work done today you will be contacted with your lab results within the next 2 weeks.  If you have not heard from us then please contact us. The fastest way to get your results is to register for My Chart. ° ° °IF you received an x-ray today, you will receive an invoice from [facility]. Please contact [facility] at [phone] with questions or concerns regarding your invoice.  ° °IF you received labwork today, you will receive an invoice from [facility]. Please contact [facility] at [phone] with questions or concerns regarding your invoice.  ° °Our billing staff will not be able to assist you with questions regarding bills from these companies. ° °You will be contacted with the lab results as soon as they are available. The fastest way to get your results is to activate your My Chart account. Instructions are located on the last page of this paperwork. If you have not heard from us regarding the results in 2 weeks, please contact this office. °  ° ° ° °

## 2020-04-27 NOTE — Telephone Encounter (Signed)
Sent  Thanks  Rich Becca Bayne, NP

## 2020-04-27 NOTE — Telephone Encounter (Signed)
Please Advise

## 2020-04-28 LAB — COMPREHENSIVE METABOLIC PANEL
ALT: 56 IU/L — ABNORMAL HIGH (ref 0–44)
AST: 32 IU/L (ref 0–40)
Albumin/Globulin Ratio: 2 (ref 1.2–2.2)
Albumin: 4.7 g/dL (ref 4.1–5.2)
Alkaline Phosphatase: 127 IU/L — ABNORMAL HIGH (ref 39–117)
BUN/Creatinine Ratio: 12 (ref 9–20)
BUN: 10 mg/dL (ref 6–20)
Bilirubin Total: 0.3 mg/dL (ref 0.0–1.2)
CO2: 22 mmol/L (ref 20–29)
Calcium: 9.7 mg/dL (ref 8.7–10.2)
Chloride: 101 mmol/L (ref 96–106)
Creatinine, Ser: 0.86 mg/dL (ref 0.76–1.27)
GFR calc Af Amer: 141 mL/min/{1.73_m2} (ref >59)
GFR calc non Af Amer: 122 mL/min/{1.73_m2} (ref >59)
Globulin, Total: 2.4 g/dL (ref 1.5–4.5)
Glucose: 89 mg/dL (ref 65–99)
Potassium: 4.5 mmol/L (ref 3.5–5.2)
Sodium: 138 mmol/L (ref 134–144)
Total Protein: 7.1 g/dL (ref 6.0–8.5)

## 2020-04-28 LAB — CBC WITH DIFFERENTIAL/PLATELET
Basophils Absolute: 0.1 10*3/uL (ref 0.0–0.2)
Basos: 1 %
EOS (ABSOLUTE): 0.5 10*3/uL — ABNORMAL HIGH (ref 0.0–0.4)
Eos: 7 %
Hematocrit: 50.2 % (ref 37.5–51.0)
Hemoglobin: 17.3 g/dL (ref 13.0–17.7)
Immature Grans (Abs): 0 10*3/uL (ref 0.0–0.1)
Immature Granulocytes: 0 %
Lymphocytes Absolute: 3.7 10*3/uL — ABNORMAL HIGH (ref 0.7–3.1)
Lymphs: 52 %
MCH: 30.7 pg (ref 26.6–33.0)
MCHC: 34.5 g/dL (ref 31.5–35.7)
MCV: 89 fL (ref 79–97)
Monocytes Absolute: 0.6 10*3/uL (ref 0.1–0.9)
Monocytes: 8 %
Neutrophils Absolute: 2.3 10*3/uL (ref 1.4–7.0)
Neutrophils: 32 %
Platelets: 249 10*3/uL (ref 150–450)
RBC: 5.63 x10E6/uL (ref 4.14–5.80)
RDW: 12.7 % (ref 11.6–15.4)
WBC: 7.1 10*3/uL (ref 3.4–10.8)

## 2020-04-28 LAB — LIPID PANEL
Chol/HDL Ratio: 5.3 ratio — ABNORMAL HIGH (ref 0.0–5.0)
Cholesterol, Total: 222 mg/dL — ABNORMAL HIGH (ref 100–199)
HDL: 42 mg/dL (ref >39)
LDL Chol Calc (NIH): 142 mg/dL — ABNORMAL HIGH (ref 0–99)
Triglycerides: 209 mg/dL — ABNORMAL HIGH (ref 0–149)
VLDL Cholesterol Cal: 38 mg/dL (ref 5–40)

## 2020-04-28 LAB — HIV ANTIBODY (ROUTINE TESTING W REFLEX): HIV Screen 4th Generation wRfx: NONREACTIVE

## 2020-04-28 LAB — TSH: TSH: 1.18 u[IU]/mL (ref 0.450–4.500)

## 2020-04-28 NOTE — Progress Notes (Signed)
Hello!  If we could send a letter to Mr. Harvin - His lipids are a little bit high, and his LFTs are showing a small elevation. He should cut back on fatty foods, processed foods, and fried foods for the next few months - we can recheck this in about 6 months to monitor.  Thank you!  Jari Sportsman, NP

## 2020-06-06 ENCOUNTER — Emergency Department (HOSPITAL_COMMUNITY)
Admission: EM | Admit: 2020-06-06 | Discharge: 2020-06-07 | Disposition: A | Discharge status: Home / Self Care | Payer: BC Managed Care – PPO | Attending: Emergency Medicine | Admitting: Emergency Medicine

## 2020-06-06 ENCOUNTER — Encounter (HOSPITAL_COMMUNITY): Payer: Self-pay

## 2020-06-06 DIAGNOSIS — R0602 Shortness of breath: Secondary | ICD-10-CM | POA: Diagnosis not present

## 2020-06-06 DIAGNOSIS — R0609 Other forms of dyspnea: Secondary | ICD-10-CM | POA: Diagnosis not present

## 2020-06-06 DIAGNOSIS — Z5321 Procedure and treatment not carried out due to patient leaving prior to being seen by health care provider: Secondary | ICD-10-CM | POA: Diagnosis not present

## 2020-06-06 DIAGNOSIS — R062 Wheezing: Secondary | ICD-10-CM | POA: Diagnosis not present

## 2020-06-06 DIAGNOSIS — R0902 Hypoxemia: Secondary | ICD-10-CM | POA: Diagnosis not present

## 2020-06-06 MED ORDER — ALBUTEROL SULFATE HFA 108 (90 BASE) MCG/ACT IN AERS
8.0000 | INHALATION_SPRAY | Freq: Once | RESPIRATORY_TRACT | Status: AC
  Administered 2020-06-06: 8 via RESPIRATORY_TRACT
  Filled 2020-06-06: qty 6.7

## 2020-06-06 NOTE — ED Triage Notes (Signed)
Pt reports that his asthma has been acting up since last night, out of his inhaler, pt with labored breathing, audible wheezing, denies fevers.

## 2020-06-07 NOTE — ED Notes (Addendum)
Pt seen leaving waiting room.

## 2020-06-13 ENCOUNTER — Other Ambulatory Visit: Payer: Self-pay | Admitting: Registered Nurse

## 2020-06-13 DIAGNOSIS — F988 Other specified behavioral and emotional disorders with onset usually occurring in childhood and adolescence: Secondary | ICD-10-CM

## 2020-06-13 NOTE — Telephone Encounter (Signed)
06/13/2020 - PATIENT HAS REQUESTED A REFILL ON HIS ADDERALL 20 MG. HE DOES NOT HAVE ANY MORE REFILLS. THIS MESSAGE IS TO LET THE CLINICAL STAFF KNOW PATIENT HAS AN OFFICE VISIT SCHEDULED WITH RICH MORROW ON Wednesday 06/15/2020 AT 2:30pm. MBC

## 2020-06-13 NOTE — Telephone Encounter (Signed)
I called and left a msg for patient informing him that he should have another rx at the pharmacy for the Adderall. If for some reason they do not have it give [place] a call back

## 2020-06-13 NOTE — Telephone Encounter (Signed)
Rx at pharmacy

## 2020-06-15 ENCOUNTER — Ambulatory Visit (INDEPENDENT_AMBULATORY_CARE_PROVIDER_SITE_OTHER): Payer: BC Managed Care – PPO | Admitting: Registered Nurse

## 2020-06-15 ENCOUNTER — Encounter: Payer: Self-pay | Admitting: Registered Nurse

## 2020-06-15 ENCOUNTER — Other Ambulatory Visit: Payer: Self-pay

## 2020-06-15 ENCOUNTER — Other Ambulatory Visit: Payer: Self-pay | Admitting: Registered Nurse

## 2020-06-15 DIAGNOSIS — F988 Other specified behavioral and emotional disorders with onset usually occurring in childhood and adolescence: Secondary | ICD-10-CM

## 2020-06-15 NOTE — Patient Instructions (Signed)
° ° ° °  If you have lab work done today you will be contacted with your lab results within the next 2 weeks.  If you have not heard from us then please contact us. The fastest way to get your results is to register for My Chart. ° ° °IF you received an x-ray today, you will receive an invoice from [facility]. Please contact [facility] at [phone] with questions or concerns regarding your invoice.  ° °IF you received labwork today, you will receive an invoice from [facility]. Please contact [facility] at [phone] with questions or concerns regarding your invoice.  ° °Our billing staff will not be able to assist you with questions regarding bills from these companies. ° °You will be contacted with the lab results as soon as they are available. The fastest way to get your results is to activate your My Chart account. Instructions are located on the last page of this paperwork. If you have not heard from us regarding the results in 2 weeks, please contact this office. °  ° ° ° °

## 2020-06-16 MED ORDER — AMPHETAMINE-DEXTROAMPHETAMINE 20 MG PO TABS: 20.0000 mg | ORAL_TABLET | Freq: Two times a day (BID) | ORAL | 0 refills | Status: DC

## 2020-06-16 NOTE — Telephone Encounter (Signed)
Unable to reach patient by phone and unable to leave a msg.When patient return call please let him know that I have spoken with the pharmacy and the Adderall is just too soon to pick up. The Rx will be ready for pick up on 06/22/20

## 2020-06-16 NOTE — Telephone Encounter (Signed)
Too soon to pick up. Look at previous msg

## 2020-06-16 NOTE — Progress Notes (Signed)
Established Patient Office Visit  Subjective:  Patient ID: Mitchell Nelson, male    DOB: 24-Aug-1995  Age: 25 y.o. MRN: [id number]  CC:  Chief Complaint  Patient presents with  . Medication Refill    Patient state he would like the medication refill for Adderal. and also to discuss his lab results     HPI [facility] presents for medication refill on adderall and lab review  ADD: Taking adderall 20mg  PO bid. Takes 1 - 1.5 tabs usually. Denies AEs, no trouble sleeping, palpitations, or other side effects of note.  PDMP consulted  No other concerns today.   Past Medical History:  Diagnosis Date  . ADD (attention deficit disorder)   . Asthma     No past surgical history on file.  Family History  Problem Relation Age of Onset  . Diabetes Maternal Grandmother     Social History   Socioeconomic History  . Marital status: Single    Spouse name: Not on file  . Number of children: Not on file  . Years of education: Not on file  . Highest education level: Not on file  Occupational History  . Not on file  Tobacco Use  . Smoking status: Former Smoker    Packs/day: 0.25    Years: 6.00    Pack years: 1.50    Quit date: 08/31/2018    Years since quitting: 1.7  . Smokeless tobacco: Current User    Types: Chew  Vaping Use  . Vaping Use: Never used  Substance and Sexual Activity  . Alcohol use: No    Alcohol/week: 0.0 standard drinks  . Drug use: No  . Sexual activity: Yes  Other Topics Concern  . Not on file  Social History Narrative          Social Determinants of Health   Financial Resource Strain:   . Difficulty of Paying Living Expenses:   Food Insecurity:   . Worried About 10/31/2018 in the Last Year:   . Programme researcher, broadcasting/film/video in the Last Year:   Transportation Needs:   . Barista (Medical):   Freight forwarder Lack of Transportation (Non-Medical):   Physical Activity:   . Days of Exercise per Week:   . Minutes of Exercise  per Session:   Stress:   . Feeling of Stress :   Social Connections:   . Frequency of Communication with Friends and Family:   . Frequency of Social Gatherings with Friends and Family:   . Attends Religious Services:   . Active Member of Clubs or Organizations:   . Attends [employer] Meetings:   Banker Marital Status:   Intimate Partner Violence:   . Fear of Current or Ex-Partner:   . Emotionally Abused:   Marland Kitchen Physically Abused:   . Sexually Abused:     Outpatient Medications Prior to Visit  Medication Sig Dispense Refill  . albuterol (VENTOLIN HFA) 108 (90 Base) MCG/ACT inhaler Inhale 2 puffs into the lungs every 4 (four) hours as needed for wheezing or shortness of breath. 1 Inhaler 3  . cephALEXin (KEFLEX) 500 MG capsule Take 1 capsule (500 mg total) by mouth 3 (three) times daily. (Patient not taking: Reported on 05/02/2019) 30 capsule 0  . EPINEPHrine 0.3 mg/0.3 mL IJ SOAJ injection Inject 0.3 mLs (0.3 mg total) into the muscle as needed for anaphylaxis. (Patient not taking: Reported on 04/27/2020) 1 each 2  . HYDROcodone-acetaminophen (NORCO/VICODIN) 5-325 MG per tablet 1  to 2 tabs every 4 to 6 hours as needed for pain. (Patient not taking: Reported on 05/02/2019) 20 tablet 0  . Mometasone Furoate (ASMANEX HFA) 100 MCG/ACT AERO Inhale 2 puffs into the lungs 2 (two) times daily. (Patient not taking: Reported on 01/14/2019) 1 Inhaler 11  . amphetamine-dextroamphetamine (ADDERALL) 20 MG tablet Take 1 tablet (20 mg total) by mouth 2 (two) times daily. 60 tablet 0  . amphetamine-dextroamphetamine (ADDERALL) 20 MG tablet Take 1 tablet (20 mg total) by mouth 2 (two) times daily. 60 tablet 0  . [START ON 06/22/2020] amphetamine-dextroamphetamine (ADDERALL) 20 MG tablet Take 1 tablet (20 mg total) by mouth 2 (two) times daily. 60 tablet 0  . Melatonin 5 MG TABS Take 5 mg by mouth at bedtime. (Patient not taking: Reported on 06/15/2020)    . Multiple Vitamin (MULTIVITAMIN WITH MINERALS) TABS  tablet Take 1 tablet by mouth daily.    . predniSONE (DELTASONE) 20 MG tablet Take 3 tablets (60 mg total) by mouth daily. (Patient not taking: Reported on 04/27/2020) 12 tablet 0   No facility-administered medications prior to visit.    No Known Allergies  ROS Review of Systems  Constitutional: Negative.   HENT: Negative.   Eyes: Negative.   Respiratory: Negative.   Cardiovascular: Negative.   Gastrointestinal: Negative.   Endocrine: Negative.   Genitourinary: Negative.   Musculoskeletal: Negative.   Skin: Negative.   Allergic/Immunologic: Negative.   Neurological: Negative.   Hematological: Negative.   Psychiatric/Behavioral: Negative.   All other systems reviewed and are negative.     Objective:    Physical Exam Vitals and nursing note reviewed.  Constitutional:      General: He is not in acute distress.    Appearance: Normal appearance. He is normal weight. He is not ill-appearing, toxic-appearing or diaphoretic.  Cardiovascular:     Rate and Rhythm: Normal rate and regular rhythm.  Pulmonary:     Effort: Pulmonary effort is normal. No respiratory distress.  Neurological:     Mental Status: He is alert.  Psychiatric:        Mood and Affect: Mood normal.        Behavior: Behavior normal.        Thought Content: Thought content normal.        Judgment: Judgment normal.     BP 116/82   Pulse 86   Temp (!) 97.4 F (36.3 C) (Temporal)   Resp 18   Ht 5\' 9"  (1.753 m)   Wt 195 lb 9.6 oz (88.7 kg)   SpO2 95%   BMI 28.89 kg/m  Wt Readings from Last 3 Encounters:  06/15/20 195 lb 9.6 oz (88.7 kg)  04/27/20 194 lb 3.2 oz (88.1 kg)  06/20/19 200 lb (90.7 kg)     There are no preventive care reminders to display for this patient.  There are no preventive care reminders to display for this patient.  Lab Results  Component Value Date   TSH 1.180 04/27/2020   Lab Results  Component Value Date   WBC 7.1 04/27/2020   HGB 17.3 04/27/2020   HCT 50.2  04/27/2020   MCV 89 04/27/2020   PLT 249 04/27/2020   Lab Results  Component Value Date   NA 138 04/27/2020   K 4.5 04/27/2020   CO2 22 04/27/2020   GLUCOSE 89 04/27/2020   BUN 10 04/27/2020   CREATININE 0.86 04/27/2020   BILITOT 0.3 04/27/2020   ALKPHOS 127 (H) 04/27/2020   AST 32 04/27/2020  ALT 56 (H) 04/27/2020   PROT 7.1 04/27/2020   ALBUMIN 4.7 04/27/2020   CALCIUM 9.7 04/27/2020   ANIONGAP 11 06/01/2019   Lab Results  Component Value Date   CHOL 222 (H) 04/27/2020   Lab Results  Component Value Date   HDL 42 04/27/2020   Lab Results  Component Value Date   LDLCALC 142 (H) 04/27/2020   Lab Results  Component Value Date   TRIG 209 (H) 04/27/2020   Lab Results  Component Value Date   CHOLHDL 5.3 (H) 04/27/2020   No results found for: HGBA1C    Assessment & Plan:   Problem List Items Addressed This Visit      Other   ADD (attention deficit disorder)   Relevant Medications   amphetamine-dextroamphetamine (ADDERALL) 20 MG tablet (Start on 08/11/2020)   amphetamine-dextroamphetamine (ADDERALL) 20 MG tablet (Start on 07/20/2020)   amphetamine-dextroamphetamine (ADDERALL) 20 MG tablet      Meds ordered this encounter  Medications  . amphetamine-dextroamphetamine (ADDERALL) 20 MG tablet    Sig: Take 1 tablet (20 mg total) by mouth 2 (two) times daily.    Dispense:  60 tablet    Refill:  0    Order Specific Question:   Supervising Provider    Answer:   Neva Seat, JEFFREY R [[id number]]  . amphetamine-dextroamphetamine (ADDERALL) 20 MG tablet    Sig: Take 1 tablet (20 mg total) by mouth 2 (two) times daily.    Dispense:  60 tablet    Refill:  0    Order Specific Question:   Supervising Provider    Answer:   Neva Seat, JEFFREY R [[id number]]  . amphetamine-dextroamphetamine (ADDERALL) 20 MG tablet    Sig: Take 1 tablet (20 mg total) by mouth 2 (two) times daily.    Dispense:  60 tablet    Refill:  0    Order Specific Question:   Supervising Provider    Answer:    Neva Seat, JEFFREY R [[id number]]    Follow-up: No follow-ups on file.   PLAN  Refill x 3 mo  Return for follow up at that time  Patient encouraged to call clinic with any questions, comments, or concerns.  [name], NP

## 2020-06-16 NOTE — Telephone Encounter (Signed)
I spoke to the pharmacy and they stated patient was too soon for his 06/22/20 rx. But patient stated at his visit in April you was only going to sent in that one month then see him on 06/15/20 and then you was going to send in 3 rx at that time. So patient never picked up 05/25/20 rx and the pharmacy was just going off the next rx due which was 06/22/20. So with this being said patient stated his understanding after his 06/15/20 visit there would be 3 rx. Patient did not want to be off coarse and have issus with his next 3 rx. So can you send in 2 more rx from 06/16/20 date. SO this will be on coarse with the pharmacy. I did verify this with the pharmacy that he did not pick up 05/25/20 Rx

## 2020-06-19 ENCOUNTER — Emergency Department (HOSPITAL_COMMUNITY)
Admission: EM | Admit: 2020-06-19 | Discharge: 2020-06-19 | Disposition: A | Discharge status: Home / Self Care | Payer: BC Managed Care – PPO | Attending: Emergency Medicine | Admitting: Emergency Medicine

## 2020-06-19 ENCOUNTER — Encounter (HOSPITAL_COMMUNITY): Payer: Self-pay | Admitting: Emergency Medicine

## 2020-06-19 ENCOUNTER — Other Ambulatory Visit: Payer: Self-pay

## 2020-06-19 DIAGNOSIS — J45909 Unspecified asthma, uncomplicated: Secondary | ICD-10-CM | POA: Diagnosis not present

## 2020-06-19 DIAGNOSIS — Z5321 Procedure and treatment not carried out due to patient leaving prior to being seen by health care provider: Secondary | ICD-10-CM | POA: Diagnosis not present

## 2020-06-19 MED ORDER — ALBUTEROL SULFATE HFA 108 (90 BASE) MCG/ACT IN AERS
1.0000 | INHALATION_SPRAY | Freq: Once | RESPIRATORY_TRACT | Status: AC
  Administered 2020-06-19: 2 via RESPIRATORY_TRACT
  Filled 2020-06-19: qty 6.7

## 2020-06-19 NOTE — ED Notes (Signed)
Patient called for room placement x1 with no answer. 

## 2020-06-19 NOTE — ED Triage Notes (Addendum)
Patient reports wheezing since this morning. Reports taking breathing treatment this morning. States he does not have inhaler at home. Patient diaphoretic. Speaking in full sentences in triage.

## 2020-06-19 NOTE — ED Notes (Signed)
PT was not in lobby upon calling, Front desk reported PT went outside and has not returned. Will attempt to locate again shortly.

## 2020-06-19 NOTE — ED Notes (Signed)
Patient called for room placement x3 without answer. 

## 2020-06-19 NOTE — ED Notes (Signed)
Patient called for room placement x2 without answer. 

## 2020-06-20 ENCOUNTER — Other Ambulatory Visit: Payer: Self-pay

## 2020-06-20 DIAGNOSIS — J4541 Moderate persistent asthma with (acute) exacerbation: Secondary | ICD-10-CM

## 2020-06-20 MED ORDER — ALBUTEROL SULFATE HFA 108 (90 BASE) MCG/ACT IN AERS: 2.0000 | INHALATION_SPRAY | RESPIRATORY_TRACT | 3 refills | Status: DC | PRN

## 2020-07-01 DIAGNOSIS — F1120 Opioid dependence, uncomplicated: Secondary | ICD-10-CM

## 2020-07-01 HISTORY — DX: Opioid dependence, uncomplicated: F11.20

## 2020-07-02 DIAGNOSIS — R062 Wheezing: Secondary | ICD-10-CM | POA: Insufficient documentation

## 2020-07-02 DIAGNOSIS — J4540 Moderate persistent asthma, uncomplicated: Secondary | ICD-10-CM | POA: Insufficient documentation

## 2020-07-02 HISTORY — DX: Wheezing: R06.2

## 2020-07-14 DIAGNOSIS — Z20828 Contact with and (suspected) exposure to other viral communicable diseases: Secondary | ICD-10-CM | POA: Diagnosis not present

## 2020-07-28 DIAGNOSIS — Z20828 Contact with and (suspected) exposure to other viral communicable diseases: Secondary | ICD-10-CM | POA: Diagnosis not present

## 2020-08-03 DIAGNOSIS — Z20822 Contact with and (suspected) exposure to covid-19: Secondary | ICD-10-CM | POA: Diagnosis not present

## 2020-09-02 ENCOUNTER — Encounter: Payer: Self-pay | Admitting: Registered Nurse

## 2020-09-02 ENCOUNTER — Other Ambulatory Visit: Payer: Self-pay

## 2020-09-02 ENCOUNTER — Ambulatory Visit (INDEPENDENT_AMBULATORY_CARE_PROVIDER_SITE_OTHER): Payer: BC Managed Care – PPO | Admitting: Registered Nurse

## 2020-09-02 ENCOUNTER — Ambulatory Visit (INDEPENDENT_AMBULATORY_CARE_PROVIDER_SITE_OTHER): Payer: BC Managed Care – PPO

## 2020-09-02 DIAGNOSIS — R0782 Intercostal pain: Secondary | ICD-10-CM

## 2020-09-02 DIAGNOSIS — R000 Tachycardia, unspecified: Secondary | ICD-10-CM

## 2020-09-02 DIAGNOSIS — R079 Chest pain, unspecified: Secondary | ICD-10-CM | POA: Diagnosis not present

## 2020-09-02 LAB — POCT CBC
Granulocyte percent: 48.5 %G (ref 37–80)
HCT, POC: 46.2 % — AB (ref 29–41)
Hemoglobin: 15.5 g/dL — AB (ref 11–14.6)
Lymph, poc: 2 (ref 0.6–3.4)
MCH, POC: 30.6 pg (ref 27–31.2)
MCHC: 33.5 g/dL (ref 31.8–35.4)
MCV: 91.2 fL (ref 76–111)
MID (cbc): 0.4 (ref 0–0.9)
MPV: 8.1 fL (ref 0–99.8)
POC Granulocyte: 2.2 (ref 2–6.9)
POC LYMPH PERCENT: 42.4 %L (ref 10–50)
POC MID %: 9.1 %M (ref 0–12)
Platelet Count, POC: 244 10*3/uL (ref 142–424)
RBC: 5.06 M/uL (ref 4.69–6.13)
RDW, POC: 12.1 %
WBC: 4.6 10*3/uL (ref 4.6–10.2)

## 2020-09-02 LAB — BASIC METABOLIC PANEL
BUN/Creatinine Ratio: 10 (ref 9–20)
BUN: 9 mg/dL (ref 6–20)
CO2: 22 mmol/L (ref 20–29)
Calcium: 10.1 mg/dL (ref 8.7–10.2)
Chloride: 102 mmol/L (ref 96–106)
Creatinine, Ser: 0.93 mg/dL (ref 0.76–1.27)
GFR calc Af Amer: 133 mL/min/{1.73_m2} (ref >59)
GFR calc non Af Amer: 115 mL/min/{1.73_m2} (ref >59)
Glucose: 97 mg/dL (ref 65–99)
Potassium: 4.1 mmol/L (ref 3.5–5.2)
Sodium: 138 mmol/L (ref 134–144)

## 2020-09-02 NOTE — Progress Notes (Signed)
Established Patient Office Visit  Subjective:  Patient ID: Mitchell Nelson, male    DOB: 12-12-1995  Age: 25 y.o. MRN: [id number]  CC:  Chief Complaint  Patient presents with   Palpitations    Patient states he has been having rapid heart rate , lightheaded and dizzy, and also some left side pain. Per patient he has not been taking the albuterol because he thought that was causing this issue. No bruising at all but the side is tender to touch.    HPI [facility] presents for palpitations and chest tenderness. Some lightheadedness, some dizziness. No LOC. Has not been taking albuterol as he was concerned this was contributing to his symptoms.  No bruising or rash noted on ribs. States pain is cramping. Low level baseline pain, often 0/10, but some point tenderness with palpation. No shob, doe. Palpitations: feels like heart is racing/pounding. No skipped beats, no loc, dizziness.    Past Medical History:  Diagnosis Date   ADD (attention deficit disorder)    Asthma     No past surgical history on file.  Family History  Problem Relation Age of Onset   Diabetes Maternal Grandmother     Social History   Socioeconomic History   Marital status: Single    Spouse name: Not on file   Number of children: Not on file   Years of education: Not on file   Highest education level: Not on file  Occupational History   Not on file  Tobacco Use   Smoking status: Current Some Day Smoker    Packs/day: 0.25    Years: 6.00    Pack years: 1.50    Types: Cigarettes    Last attempt to quit: 08/31/2018    Years since quitting: 2.0   Smokeless tobacco: Current User    Types: Chew  Vaping Use   Vaping Use: Never used  Substance and Sexual Activity   Alcohol use: No    Alcohol/week: 0.0 standard drinks   Drug use: No   Sexual activity: Yes  Other Topics Concern   Not on file  Social History Narrative          Social Determinants of Health    Financial Resource Strain:    Difficulty of Paying Living Expenses: Not on file  Food Insecurity:    Worried About Programme researcher, broadcasting/film/video in the Last Year: Not on file   [employer] of Food in the Last Year: Not on file  Transportation Needs:    Lack of Transportation (Medical): Not on file   Lack of Transportation (Non-Medical): Not on file  Physical Activity:    Days of Exercise per Week: Not on file   Minutes of Exercise per Session: Not on file  Stress:    Feeling of Stress : Not on file  Social Connections:    Frequency of Communication with Friends and Family: Not on file   Frequency of Social Gatherings with Friends and Family: Not on file   Attends Religious Services: Not on file   Active Member of Clubs or Organizations: Not on file   Attends Banker Meetings: Not on file   Marital Status: Not on file  Intimate Partner Violence:    Fear of Current or Ex-Partner: Not on file   Emotionally Abused: Not on file   Physically Abused: Not on file   Sexually Abused: Not on file    Outpatient Medications Prior to Visit  Medication Sig Dispense Refill  albuterol (VENTOLIN HFA) 108 (90 Base) MCG/ACT inhaler Inhale 2 puffs into the lungs every 4 (four) hours as needed for wheezing or shortness of breath. 18 g 3   amphetamine-dextroamphetamine (ADDERALL) 20 MG tablet Take 1 tablet (20 mg total) by mouth 2 (two) times daily. 60 tablet 0   amphetamine-dextroamphetamine (ADDERALL) 20 MG tablet Take 1 tablet (20 mg total) by mouth 2 (two) times daily. 60 tablet 0   amphetamine-dextroamphetamine (ADDERALL) 20 MG tablet Take 1 tablet (20 mg total) by mouth 2 (two) times daily. 60 tablet 0   cephALEXin (KEFLEX) 500 MG capsule Take 1 capsule (500 mg total) by mouth 3 (three) times daily. (Patient not taking: Reported on 05/02/2019) 30 capsule 0   EPINEPHrine 0.3 mg/0.3 mL IJ SOAJ injection Inject 0.3 mLs (0.3 mg total) into the muscle as needed for anaphylaxis.  (Patient not taking: Reported on 04/27/2020) 1 each 2   HYDROcodone-acetaminophen (NORCO/VICODIN) 5-325 MG per tablet 1 to 2 tabs every 4 to 6 hours as needed for pain. (Patient not taking: Reported on 05/02/2019) 20 tablet 0   Mometasone Furoate (ASMANEX HFA) 100 MCG/ACT AERO Inhale 2 puffs into the lungs 2 (two) times daily. (Patient not taking: Reported on 01/14/2019) 1 Inhaler 11   No facility-administered medications prior to visit.    No Known Allergies  ROS Review of Systems  Constitutional: Negative.   HENT: Negative.   Eyes: Negative.   Respiratory: Negative.   Cardiovascular: Positive for palpitations. Negative for chest pain and leg swelling.  Gastrointestinal: Negative.   Endocrine: Negative.   Genitourinary: Negative.   Musculoskeletal: Negative.   Skin: Negative.   Allergic/Immunologic: Negative.   Neurological: Negative.   Hematological: Negative.   Psychiatric/Behavioral: Negative.       Objective:    Physical Exam Vitals and nursing note reviewed.  Constitutional:      General: He is not in acute distress.    Appearance: Normal appearance. He is normal weight. He is not ill-appearing, toxic-appearing or diaphoretic.  Cardiovascular:     Rate and Rhythm: Normal rate and regular rhythm.     Heart sounds: Normal heart sounds. No murmur heard.  No friction rub. No gallop.   Pulmonary:     Effort: Pulmonary effort is normal. No respiratory distress.     Breath sounds: Normal breath sounds. No stridor. No wheezing, rhonchi or rales.  Chest:     Chest wall: No tenderness.  Skin:    General: Skin is warm and dry.     Capillary Refill: Capillary refill takes less than 2 seconds.     Coloration: Skin is not jaundiced.     Findings: No bruising or lesion.  Neurological:     General: No focal deficit present.     Mental Status: He is alert and oriented to person, place, and time. Mental status is at baseline.  Psychiatric:        Mood and Affect: Mood normal.         Behavior: Behavior normal.        Thought Content: Thought content normal.        Judgment: Judgment normal.     There were no vitals taken for this visit. Wt Readings from Last 3 Encounters:  06/15/20 195 lb 9.6 oz (88.7 kg)  04/27/20 194 lb 3.2 oz (88.1 kg)  06/20/19 200 lb (90.7 kg)     There are no preventive care reminders to display for this patient.  There are no preventive care reminders to  display for this patient.  Lab Results  Component Value Date   TSH 1.180 04/27/2020   Lab Results  Component Value Date   WBC 4.6 09/02/2020   HGB 15.5 (A) 09/02/2020   HCT 46.2 (A) 09/02/2020   MCV 91.2 09/02/2020   PLT 249 04/27/2020   Lab Results  Component Value Date   NA 138 04/27/2020   K 4.5 04/27/2020   CO2 22 04/27/2020   GLUCOSE 89 04/27/2020   BUN 10 04/27/2020   CREATININE 0.86 04/27/2020   BILITOT 0.3 04/27/2020   ALKPHOS 127 (H) 04/27/2020   AST 32 04/27/2020   ALT 56 (H) 04/27/2020   PROT 7.1 04/27/2020   ALBUMIN 4.7 04/27/2020   CALCIUM 9.7 04/27/2020   ANIONGAP 11 06/01/2019   Lab Results  Component Value Date   CHOL 222 (H) 04/27/2020   Lab Results  Component Value Date   HDL 42 04/27/2020   Lab Results  Component Value Date   LDLCALC 142 (H) 04/27/2020   Lab Results  Component Value Date   TRIG 209 (H) 04/27/2020   Lab Results  Component Value Date   CHOLHDL 5.3 (H) 04/27/2020   No results found for: HGBA1C    Assessment & Plan:   Problem List Items Addressed This Visit    None    Visit Diagnoses    Rapid heart rate    -  Primary   Relevant Orders   EKG 12-Lead (Completed)   DG Chest 2 View (Completed)   POCT CBC (Completed)   Novel Coronavirus, NAA ([facility])   Basic Metabolic Panel   Intercostal pain       Relevant Orders   DG Chest 2 View (Completed)   POCT CBC (Completed)   Novel Coronavirus, NAA ([facility])   Basic Metabolic Panel      No orders of the defined types were placed in this  encounter.   Follow-up: No follow-ups on file.   PLAN  Dg chest wnl  EKG wnl. Compared to 06/22/19, still sinus rhythm but now rate is wnl.   poct cbc showing no abnormalities. Will send out bmp and COVID testing  Suspect anxiety component - discussed other stimulant use with adderall as well. No clear etiology. Will follow up on labs and with patient based on his symptoms  Patient encouraged to call clinic with any questions, comments, or concerns.  [name], NP

## 2020-09-02 NOTE — Patient Instructions (Signed)
° ° ° °  If you have lab work done today you will be contacted with your lab results within the next 2 weeks.  If you have not heard from us then please contact us. The fastest way to get your results is to register for My Chart. ° ° °IF you received an x-ray today, you will receive an invoice from [facility]. Please contact [facility] at [phone] with questions or concerns regarding your invoice.  ° °IF you received labwork today, you will receive an invoice from [facility]. Please contact [facility] at [phone] with questions or concerns regarding your invoice.  ° °Our billing staff will not be able to assist you with questions regarding bills from these companies. ° °You will be contacted with the lab results as soon as they are available. The fastest way to get your results is to activate your My Chart account. Instructions are located on the last page of this paperwork. If you have not heard from us regarding the results in 2 weeks, please contact this office. °  ° ° ° °

## 2020-09-02 NOTE — Telephone Encounter (Signed)
Patient states he stopped the adderrall

## 2020-09-04 ENCOUNTER — Encounter: Payer: Self-pay | Admitting: Registered Nurse

## 2020-09-04 LAB — NOVEL CORONAVIRUS, NAA: SARS-CoV-2, NAA: NOT DETECTED

## 2020-09-06 ENCOUNTER — Encounter: Payer: Self-pay | Admitting: Registered Nurse

## 2020-09-09 DIAGNOSIS — F17200 Nicotine dependence, unspecified, uncomplicated: Secondary | ICD-10-CM | POA: Insufficient documentation

## 2020-09-15 ENCOUNTER — Encounter: Payer: Self-pay | Admitting: Registered Nurse

## 2020-09-16 ENCOUNTER — Other Ambulatory Visit: Payer: Self-pay | Admitting: Registered Nurse

## 2020-09-16 DIAGNOSIS — F988 Other specified behavioral and emotional disorders with onset usually occurring in childhood and adolescence: Secondary | ICD-10-CM

## 2020-09-16 MED ORDER — AMPHETAMINE-DEXTROAMPHETAMINE 20 MG PO TABS: 20.0000 mg | ORAL_TABLET | Freq: Two times a day (BID) | ORAL | 0 refills | Status: DC

## 2020-09-16 NOTE — Telephone Encounter (Signed)
Patient is requesting a refill of the following medications: Requested Prescriptions   Pending Prescriptions Disp Refills   amphetamine-dextroamphetamine (ADDERALL) 20 MG tablet 60 tablet 0    Sig: Take 1 tablet (20 mg total) by mouth 2 (two) times daily.    Date of patient request: 09/16/2020 Last office visit: 09/02/2020 Date of last refill: 09/16/2020 Last refill amount: 60 Follow up time period per chart: N/A

## 2020-09-24 DIAGNOSIS — Z20822 Contact with and (suspected) exposure to covid-19: Secondary | ICD-10-CM | POA: Diagnosis not present

## 2021-05-23 DIAGNOSIS — F429 Obsessive-compulsive disorder, unspecified: Secondary | ICD-10-CM | POA: Diagnosis not present

## 2021-05-23 DIAGNOSIS — G4700 Insomnia, unspecified: Secondary | ICD-10-CM | POA: Diagnosis not present

## 2021-05-23 DIAGNOSIS — F32A Depression, unspecified: Secondary | ICD-10-CM | POA: Diagnosis not present

## 2021-10-16 DIAGNOSIS — Z20822 Contact with and (suspected) exposure to covid-19: Secondary | ICD-10-CM | POA: Diagnosis not present

## 2022-08-08 ENCOUNTER — Emergency Department (HOSPITAL_COMMUNITY)
Admission: EM | Admit: 2022-08-08 | Discharge: 2022-08-08 | Disposition: A | Discharge status: Home / Self Care | Payer: BC Managed Care – PPO | Attending: Emergency Medicine | Admitting: Emergency Medicine

## 2022-08-08 ENCOUNTER — Other Ambulatory Visit: Payer: Self-pay

## 2022-08-08 ENCOUNTER — Emergency Department (HOSPITAL_COMMUNITY): Payer: BC Managed Care – PPO

## 2022-08-08 ENCOUNTER — Encounter (HOSPITAL_COMMUNITY): Payer: Self-pay

## 2022-08-08 DIAGNOSIS — F1910 Other psychoactive substance abuse, uncomplicated: Secondary | ICD-10-CM

## 2022-08-08 DIAGNOSIS — R000 Tachycardia, unspecified: Secondary | ICD-10-CM | POA: Diagnosis not present

## 2022-08-08 DIAGNOSIS — T40601A Poisoning by unspecified narcotics, accidental (unintentional), initial encounter: Secondary | ICD-10-CM | POA: Insufficient documentation

## 2022-08-08 DIAGNOSIS — T50901A Poisoning by unspecified drugs, medicaments and biological substances, accidental (unintentional), initial encounter: Secondary | ICD-10-CM | POA: Diagnosis not present

## 2022-08-08 DIAGNOSIS — I10 Essential (primary) hypertension: Secondary | ICD-10-CM | POA: Diagnosis not present

## 2022-08-08 LAB — COMPREHENSIVE METABOLIC PANEL
ALT: 52 U/L — ABNORMAL HIGH (ref 0–44)
AST: 35 U/L (ref 15–41)
Albumin: 4.5 g/dL (ref 3.5–5.0)
Alkaline Phosphatase: 114 U/L (ref 38–126)
Anion gap: 8 (ref 5–15)
BUN: 16 mg/dL (ref 6–20)
CO2: 24 mmol/L (ref 22–32)
Calcium: 8.9 mg/dL (ref 8.9–10.3)
Chloride: 107 mmol/L (ref 98–111)
Creatinine, Ser: 1.5 mg/dL — ABNORMAL HIGH (ref 0.61–1.24)
GFR, Estimated: >60 mL/min (ref >60)
Glucose, Bld: 128 mg/dL — ABNORMAL HIGH (ref 70–99)
Potassium: 4.2 mmol/L (ref 3.5–5.1)
Sodium: 139 mmol/L (ref 135–145)
Total Bilirubin: 0.5 mg/dL (ref 0.3–1.2)
Total Protein: 7.6 g/dL (ref 6.5–8.1)

## 2022-08-08 LAB — CBC WITH DIFFERENTIAL/PLATELET
Abs Immature Granulocytes: 0.22 10*3/uL — ABNORMAL HIGH (ref 0.00–0.07)
Basophils Absolute: 0 10*3/uL (ref 0.0–0.1)
Basophils Relative: 0 %
Eosinophils Absolute: 0 10*3/uL (ref 0.0–0.5)
Eosinophils Relative: 0 %
HCT: 45.5 % (ref 39.0–52.0)
Hemoglobin: 15.3 g/dL (ref 13.0–17.0)
Immature Granulocytes: 1 %
Lymphocytes Relative: 4 %
Lymphs Abs: 0.8 10*3/uL (ref 0.7–4.0)
MCH: 30.2 pg (ref 26.0–34.0)
MCHC: 33.6 g/dL (ref 30.0–36.0)
MCV: 89.7 fL (ref 80.0–100.0)
Monocytes Absolute: 1 10*3/uL (ref 0.1–1.0)
Monocytes Relative: 6 %
Neutro Abs: 15.5 10*3/uL — ABNORMAL HIGH (ref 1.7–7.7)
Neutrophils Relative %: 89 %
Platelets: 253 10*3/uL (ref 150–400)
RBC: 5.07 MIL/uL (ref 4.22–5.81)
RDW: 12.8 % (ref 11.5–15.5)
WBC: 17.5 10*3/uL — ABNORMAL HIGH (ref 4.0–10.5)
nRBC: 0 % (ref 0.0–0.2)

## 2022-08-08 LAB — RAPID URINE DRUG SCREEN, HOSP PERFORMED
Amphetamines: POSITIVE — AB
Barbiturates: NOT DETECTED
Benzodiazepines: NOT DETECTED
Cocaine: POSITIVE — AB
Opiates: POSITIVE — AB
Tetrahydrocannabinol: POSITIVE — AB

## 2022-08-08 LAB — ETHANOL: Alcohol, Ethyl (B): <10 mg/dL (ref <10)

## 2022-08-08 MED ORDER — NALOXONE HCL 4 MG/0.1ML NA LIQD
1.0000 | Freq: Once | NASAL | Status: AC
  Administered 2022-08-08: 1 via NASAL
  Filled 2022-08-08: qty 4

## 2022-08-08 MED ORDER — NALOXONE HCL 0.4 MG/ML IJ SOLN
0.4000 mg | Freq: Once | INTRAMUSCULAR | Status: AC
  Administered 2022-08-08: 0.4 mg via INTRAVENOUS
  Filled 2022-08-08: qty 1

## 2022-08-08 NOTE — ED Notes (Signed)
Patient asking when he can go home but immediately falls back to sleep.  Advised he can go home when he can stay awake during conversations.  Patient unsure who he is going to call to pick him up.

## 2022-08-08 NOTE — ED Provider Notes (Signed)
[facility]-EMERGENCY DEPT Provider Note   CSN: [id number] Arrival date & time: 08/08/22  0753     History  Chief Complaint  Patient presents with   Drug Overdose    Mitchell Nelson is a 27 y.o. male.  HPI Level 5 caveat secondary to altered mental status 27 year old male presents via EMS with report of overdose.  Patient was given Narcan at the scene and had some increase in mental status.  However, in the interim he has had intermittent decreased responsiveness and apneic episodes.     Home Medications Prior to Admission medications   Medication Sig Start Date End Date Taking? Authorizing Provider  Multiple Vitamins-Minerals (MULTIVITAMIN WITH MINERALS) tablet Take 1 tablet by mouth daily.   Yes [provider]  OVER THE COUNTER MEDICATION Take 50 mg by mouth daily. Serotonin 50 mg   Yes [provider]  QUEtiapine (SEROQUEL) 100 MG tablet Take 100 mg by mouth at bedtime.   Yes [provider]  albuterol (VENTOLIN HFA) 108 (90 Base) MCG/ACT inhaler Inhale 2 puffs into the lungs every 4 (four) hours as needed for wheezing or shortness of breath. Patient not taking: Reported on 08/08/2022 06/20/20   Mitchell Agee, NP  amphetamine-dextroamphetamine (ADDERALL) 20 MG tablet Take 1 tablet (20 mg total) by mouth 2 (two) times daily. Patient not taking: Reported on 08/08/2022 06/16/20   Mitchell Agee, NP  EPINEPHrine 0.3 mg/0.3 mL IJ SOAJ injection Inject 0.3 mLs (0.3 mg total) into the muscle as needed for anaphylaxis. Patient not taking: Reported on 04/27/2020 06/20/19   Mitchell Files, MD      Allergies    Patient has no known allergies.    Review of Systems   Review of Systems  Physical Exam Updated Vital Signs BP 117/84 (BP Location: Left Arm)   Pulse 90   Temp 97.8 F (36.6 C) (Oral)   Resp 18   Ht 1.753 m (5\' 9" )   Wt 88.5 kg   SpO2 98%   BMI 28.80 kg/m  Physical Exam Vitals and nursing note reviewed.   Constitutional:      Appearance: He is well-developed.  HENT:     Head: Normocephalic and atraumatic.     Right Ear: External ear normal.     Left Ear: External ear normal.     Nose: Nose normal.  Eyes:     Extraocular Movements: Extraocular movements intact.  Neck:     Trachea: No tracheal deviation.  Cardiovascular:     Rate and Rhythm: Regular rhythm. Tachycardia present.  Pulmonary:     Effort: Pulmonary effort is normal.  Abdominal:     General: Abdomen is flat. Bowel sounds are normal.     Palpations: Abdomen is soft.  Musculoskeletal:        General: Normal range of motion.     Cervical back: Normal range of motion.  Skin:    General: Skin is warm and dry.     Capillary Refill: Capillary refill takes less than 2 seconds.  Neurological:     Mental Status: He is alert and oriented to person, place, and time.  Psychiatric:        Mood and Affect: Mood normal.        Behavior: Behavior normal.     ED Results / Procedures / Treatments   Labs (all labs ordered are listed, but only abnormal results are displayed) Labs Reviewed  COMPREHENSIVE METABOLIC PANEL - Abnormal; Notable for the following components:  Result Value   Glucose, Bld 128 (*)    Creatinine, Ser 1.50 (*)    ALT 52 (*)    All other components within normal limits  RAPID URINE DRUG SCREEN, HOSP PERFORMED - Abnormal; Notable for the following components:   Opiates POSITIVE (*)    Cocaine POSITIVE (*)    Amphetamines POSITIVE (*)    Tetrahydrocannabinol POSITIVE (*)    All other components within normal limits  CBC WITH DIFFERENTIAL/PLATELET - Abnormal; Notable for the following components:   WBC 17.5 (*)    Neutro Abs 15.5 (*)    Abs Immature Granulocytes 0.22 (*)    All other components within normal limits  RESP PANEL BY RT-PCR (FLU A&B, COVID) [id number]  ETHANOL    EKG None  Radiology DG Chest Portable 1 View  Result Date: 08/08/2022 CLINICAL DATA:  Overdose. EXAM: PORTABLE CHEST 1 VIEW  COMPARISON:  September 02, 2020. FINDINGS: The heart size and mediastinal contours are within normal limits. Both lungs are clear. The visualized skeletal structures are unremarkable. IMPRESSION: No active disease. Electronically Signed   By: Lupita Raider M.D.   On: 08/08/2022 08:27    Procedures Procedures    Medications Ordered in ED Medications  naloxone [facility]) injection 0.4 mg (0.4 mg Intravenous Given 08/08/22 0826)  naloxone [facility]) nasal spray 4 mg/0.1 mL (1 spray Nasal Provided for home use 08/08/22 1157)    ED Course/ Medical Decision Making/ A&P Clinical Course as of 08/08/22 1339  Wed Aug 08, 2022  0939 9:39 AM Patient resting comfortably awakens and speaks to me without difficulty Oxygen in place oxygen saturation is 98% [DR]  1107 Patient off oxygen sats continue to be 98% [DR]    Clinical Course User Index [DR] Margarita Grizzle, MD                           Medical Decision Making 27 year old male recently returned from rehab yesterday.  He had been in rehab unit for 9 months.  He came home and used IV narcotics.  Required Narcan to reverse.  He has been observed here for several hours and has been intermittently sleepy.  He seems to be more awake consistently now. Patient does not wish [place] to contact family.  He denies any suicidal or homicidal ideation.  Is requesting discharge to home. We have discussed concerns for him with this overdose.  Additionally, we discussed that he is not as able to tolerate narcotics now as he was previously and that he could die.  I have urged him to have discussion with family.  He continues to refuse. Mother now at bedside annulotomy Discussed situation with her.  He was given Narcan and she voices understanding of how to use it His oxygen saturations are remaining at 98% Chest x-Mitchell Nelson reviewed and clear  Amount and/or Complexity of Data Reviewed Labs: ordered. Decision-making details documented in ED Course. Radiology: ordered and  independent interpretation performed. Decision-making details documented in ED Course.  Risk Prescription drug management.  CRITICAL CARE Performed by: Margarita Grizzle Total critical care time: 45 minutes Critical care time was exclusive of separately billable procedures and treating other patients. Critical care was necessary to treat or prevent imminent or life-threatening deterioration. Critical care was time spent personally by me on the following activities: development of treatment plan with patient and/or surrogate as well as nursing, discussions with consultants, evaluation of patient's response to treatment, examination of patient, obtaining history from  patient or surrogate, ordering and performing treatments and interventions, ordering and review of laboratory studies, ordering and review of radiographic studies, pulse oximetry and re-evaluation of patient's condition.          Final Clinical Impression(s) / ED Diagnoses Final diagnoses:  Polysubstance abuse [facility])    Rx / DC Orders ED Discharge Orders     None         Margarita Grizzle, MD 08/08/22 1339

## 2022-08-08 NOTE — ED Triage Notes (Signed)
Family found patient in the bathroom floor not breathing. Patient admits to shooting up heroin.  Denies SI attempt.  Patient does not want help getting off.  Patient still lethargic falling asleep when talking to him. Patient demanding water.

## 2022-08-08 NOTE — ED Notes (Addendum)
Found syringe and needle (insulin syringe) in patients pocket when changing patient into gown. Patient reports he was in rehab for 9 months and got out yesterday.  Reports he got heroin from the same person he always does and used around the same amount.  Patient reports he has been using heroin x 3 years.  Patient is suppose to start a job tomorrow and thinks that will help him stay clean.  Patient has requested to be a private patient and to have no visitors.

## 2022-08-08 NOTE — ED Notes (Signed)
Pt received Narcan in the field. Increased Acuity to Level 1.

## 2022-08-08 NOTE — ED Notes (Signed)
When patient falls asleep sats drop to high 80's and RR drops to 6-10  Placed on 2L [place]

## 2022-10-12 ENCOUNTER — Ambulatory Visit: Payer: BC Managed Care – PPO | Admitting: Internal Medicine

## 2022-10-23 ENCOUNTER — Ambulatory Visit (INDEPENDENT_AMBULATORY_CARE_PROVIDER_SITE_OTHER): Payer: BC Managed Care – PPO | Admitting: Internal Medicine

## 2022-10-23 ENCOUNTER — Encounter: Payer: Self-pay | Admitting: Internal Medicine

## 2022-10-23 VITALS — BP 126/84 | HR 61 | Temp 98.1°F | Resp 14 | Ht 69.0 in | Wt 227.0 lb

## 2022-10-23 DIAGNOSIS — E669 Obesity, unspecified: Secondary | ICD-10-CM | POA: Insufficient documentation

## 2022-10-23 DIAGNOSIS — F329 Major depressive disorder, single episode, unspecified: Secondary | ICD-10-CM | POA: Diagnosis not present

## 2022-10-23 DIAGNOSIS — F1994 Other psychoactive substance use, unspecified with psychoactive substance-induced mood disorder: Secondary | ICD-10-CM | POA: Diagnosis not present

## 2022-10-23 DIAGNOSIS — G4709 Other insomnia: Secondary | ICD-10-CM

## 2022-10-23 DIAGNOSIS — Z87898 Personal history of other specified conditions: Secondary | ICD-10-CM | POA: Insufficient documentation

## 2022-10-23 DIAGNOSIS — Z23 Encounter for immunization: Secondary | ICD-10-CM

## 2022-10-23 DIAGNOSIS — E65 Localized adiposity: Secondary | ICD-10-CM | POA: Insufficient documentation

## 2022-10-23 DIAGNOSIS — Z119 Encounter for screening for infectious and parasitic diseases, unspecified: Secondary | ICD-10-CM

## 2022-10-23 DIAGNOSIS — Z0000 Encounter for general adult medical examination without abnormal findings: Secondary | ICD-10-CM

## 2022-10-23 DIAGNOSIS — F1990 Other psychoactive substance use, unspecified, uncomplicated: Secondary | ICD-10-CM | POA: Insufficient documentation

## 2022-10-23 HISTORY — DX: Other insomnia: G47.09

## 2022-10-23 HISTORY — DX: Other psychoactive substance use, unspecified with psychoactive substance-induced mood disorder: F19.94

## 2022-10-23 HISTORY — DX: Personal history of other specified conditions: Z87.898

## 2022-10-23 LAB — CBC WITH DIFFERENTIAL/PLATELET
Basophils Absolute: 0 10*3/uL (ref 0.0–0.1)
Basophils Relative: 0.7 % (ref 0.0–3.0)
Eosinophils Absolute: 0.2 10*3/uL (ref 0.0–0.7)
Eosinophils Relative: 3.7 % (ref 0.0–5.0)
HCT: 47.3 % (ref 39.0–52.0)
Hemoglobin: 15.9 g/dL (ref 13.0–17.0)
Lymphocytes Relative: 43.2 % (ref 12.0–46.0)
Lymphs Abs: 2.6 10*3/uL (ref 0.7–4.0)
MCHC: 33.5 g/dL (ref 30.0–36.0)
MCV: 91 fl (ref 78.0–100.0)
Monocytes Absolute: 0.5 10*3/uL (ref 0.1–1.0)
Monocytes Relative: 7.9 % (ref 3.0–12.0)
Neutro Abs: 2.7 10*3/uL (ref 1.4–7.7)
Neutrophils Relative %: 44.5 % (ref 43.0–77.0)
Platelets: 240 10*3/uL (ref 150.0–400.0)
RBC: 5.2 Mil/uL (ref 4.22–5.81)
RDW: 13.3 % (ref 11.5–15.5)
WBC: 6.1 10*3/uL (ref 4.0–10.5)

## 2022-10-23 LAB — COMPREHENSIVE METABOLIC PANEL
ALT: 32 U/L (ref 0–53)
AST: 22 U/L (ref 0–37)
Albumin: 4.7 g/dL (ref 3.5–5.2)
Alkaline Phosphatase: 113 U/L (ref 39–117)
BUN: 14 mg/dL (ref 6–23)
CO2: 29 mEq/L (ref 19–32)
Calcium: 9.8 mg/dL (ref 8.4–10.5)
Chloride: 103 mEq/L (ref 96–112)
Creatinine, Ser: 0.86 mg/dL (ref 0.40–1.50)
GFR: 119.94 mL/min (ref >60.00)
Glucose, Bld: 98 mg/dL (ref 70–99)
Potassium: 4 mEq/L (ref 3.5–5.1)
Sodium: 140 mEq/L (ref 135–145)
Total Bilirubin: 0.5 mg/dL (ref 0.2–1.2)
Total Protein: 7.6 g/dL (ref 6.0–8.3)

## 2022-10-23 MED ORDER — OXCARBAZEPINE 150 MG PO TABS: 150.0000 mg | ORAL_TABLET | Freq: Two times a day (BID) | ORAL | Status: DC

## 2022-10-23 MED ORDER — TRAZODONE HCL 50 MG PO TABS: 25.0000 mg | ORAL_TABLET | Freq: Every evening | ORAL | 3 refills | Status: DC | PRN

## 2022-10-23 MED ORDER — SERTRALINE HCL 100 MG PO TABS: 200.0000 mg | ORAL_TABLET | Freq: Every day | ORAL | 3 refills | Status: DC

## 2022-10-23 MED ORDER — HEPATITIS A VACCINE 1440 EL U/ML IM SUSP: 1.0000 mL | Freq: Once | INTRAMUSCULAR | Status: DC

## 2022-10-23 NOTE — Assessment & Plan Note (Signed)
Referred to [facility]  

## 2022-10-23 NOTE — Progress Notes (Signed)
Today's healthcare provider: Loralee Pacas, MD  Phone: [phone]  New patient visit  Visit Date: 10/23/2022 Patient: Mitchell Nelson   DOB: January 23, 1995   27 y.o. Male  MRN: [id number]  Assessment and Plan:   Raymir was seen today for establish care and discuss depression.  History of substance use disorder Assessment & Plan: In remission and well supported I recommend continuing treatment with myself a psychiatrist and further medication adjustment as the this disorder is comorbid with severe depression but he denies any suicide risk and commits to safety Already has Narcan available and declines for me to send in any Alcohol is not a problem and so I do not see any medications for that Main concerns are stimulants and opioids- I made sure that his mother knows how to use Narcan prescribed Narcan to him so he could put it in the custody of his mother and other family support also advised to learn CPR and how to use because there is still high risk until he has been sober for much longer I used this information to encourage him to consider naltrexone, suboxone, or methadone and/or contingency management- but he declined all of these interventions at this time- he is willing to meet again and discuss more in 2 weeks though so I"m thankful for his interest in continued medical mgmt of this issue/concern I encouraged 12 step  And sponsor participation- which he reports he does. I encouraged using [facility] and [place] therapy and referred.   Orders: -     Hepatitis A vaccine adult IM -     Heplisav-B (HepB-CPG) Vaccine  Preventative health care -     Comprehensive metabolic panel -     CBC with Differential/Platelet -     Hepatitis A vaccine adult IM -     Heplisav-B (HepB-CPG) Vaccine  Screening examination for infectious disease  Obesity due to energy imbalance  IVDU (intravenous drug user) -     Hepatitis C antibody -     HIV Antibody (routine testing w rflx) -      Hepatitis A vaccine adult IM -     Heplisav-B (HepB-CPG) Vaccine  Major depressive disorder with current active episode, unspecified depression episode severity, unspecified whether recurrent -     Sertraline HCl; Take 2 tablets (200 mg total) by mouth daily. Replaces 100 mg daily dosing and increase the dose by increasing to 150 mg daily for 2 weeks and then increasing to 200 mg daily thereafter do not suddenly stop taking this medicine  Dispense: 30 tablet; Refill: 3 -     Ambulatory referral to Psychology -     OXcarbazepine  Substance induced mood disorder ([place]) Overview: Although he reports sobriety and extensive prior rehab prior to 10/23/22 depression screen:    10/23/2022   10:14 AM 09/02/2020    9:34 AM 06/15/2020    2:46 PM  PHQ9 SCORE ONLY  PHQ-9 Total Score 23 0 0    He reports that substances caused the depression.  Assessment & Plan: Referred to [facility]  Orders: -     Hepatitis A vaccine adult IM -     Heplisav-B (HepB-CPG) Vaccine  Other insomnia -     traZODone HCl; Take 0.5-1 tablets (25-50 mg total) by mouth at bedtime as needed for sleep. To enable reduced seroquel / quetiapine use, hopefully replaces  Dispense: 30 tablet; Refill: 3    AVS notes: Today the plan is... check labs, get hep a and b vaccine protection, start  using [facility] for support, continue 12 steps, increase zoloft/sertraline 100->200 daily (takes weeks to benefit), try trazodone to replace seroquel/quetiapine for insomnia, because oxcarbazepine / trileptal is getting restarted and I dont like how it plays with quetiapine, but I like that its helped your mood in past and it is a mood stabilizer and it should help while we waiting on zoloft boost to take effect  Your mom asked about trileptal side effects which I said were many but weight gain is one.  Also you should be aware that  Trileptal is an an anticonvulsant or antiepileptic medicine. It works by decreasing nerve impulses that cause seizures and pain.  But this also causes it to work as a mood stabilizer.   Do not stop using Trileptal suddenly.  Seek medical treatment if you have symptoms of a serious drug reaction: skin rash, fever, swollen glands, flu-like symptoms, unusual bruising or bleeding, or yellowing of your skin or eyes. Call your doctor right away if you have symptoms of low sodium levels in your body, such as nausea, confusion, severe weakness, muscle pain, or seizure like activity.   Health Maintenance  Topic Date Due   Hepatitis C Screening  Never done   TETANUS/TDAP  10/26/2025 (Originally 10/28/2015)   HIV Screening  Completed   INFLUENZA VACCINE  Discontinued   HPV VACCINES  Discontinued   COVID-19 Vaccine  Discontinued       Recommended follow up: Return in about 2 weeks (around 11/06/2022) for review response to recent changes.      Subjective:  Patient presents today to establish care. Main concern is depression- which he reports is caused by history of sud which is currently in remission and he has great support. Chief Complaint  Patient presents with   Establish Care   Discuss depression    For history taking, I took a per problem history from the patient and chart review as follows: Problem  Obesity Due to Energy Imbalance  History of Substance Use Disorder  Substance Induced Mood Disorder ([facility])   Although he reports sobriety and extensive prior rehab prior to 10/23/22 depression screen:    10/23/2022   10:14 AM 09/02/2020    9:34 AM 06/15/2020    2:46 PM  PHQ9 SCORE ONLY  PHQ-9 Total Score 23 0 0    He reports that substances caused the depression.   Asthma Exacerbation (Resolved)  Sinus Tachycardia (Resolved)     Depression Screen    10/23/2022   10:14 AM 09/02/2020    9:34 AM 06/15/2020    2:46 PM 04/27/2020   10:40 AM  PHQ 2/9 Scores  PHQ - 2 Score 6 0 0 0  PHQ- 9 Score 23      Results for orders placed or performed in visit on 10/23/22  Comp Met (CMET)  Result Value Ref Range    Sodium 140 135 - 145 mEq/L   Potassium 4.0 3.5 - 5.1 mEq/L   Chloride 103 96 - 112 mEq/L   CO2 29 19 - 32 mEq/L   Glucose, Bld 98 70 - 99 mg/dL   BUN 14 6 - 23 mg/dL   Creatinine, Ser 0.86 0.40 - 1.50 mg/dL   Total Bilirubin 0.5 0.2 - 1.2 mg/dL   Alkaline Phosphatase 113 39 - 117 U/L   AST 22 0 - 37 U/L   ALT 32 0 - 53 U/L   Total Protein 7.6 6.0 - 8.3 g/dL   Albumin 4.7 3.5 - 5.2 g/dL   GFR  119.94 >60.00 mL/min   Calcium 9.8 8.4 - 10.5 mg/dL  CBC w/Diff  Result Value Ref Range   WBC 6.1 4.0 - 10.5 K/uL   RBC 5.20 4.22 - 5.81 Mil/uL   Hemoglobin 15.9 13.0 - 17.0 g/dL   HCT 47.3 39.0 - 52.0 %   MCV 91.0 78.0 - 100.0 fl   MCHC 33.5 30.0 - 36.0 g/dL   RDW 13.3 11.5 - 15.5 %   Platelets 240.0 150.0 - 400.0 K/uL   Neutrophils Relative % 44.5 43.0 - 77.0 %   Lymphocytes Relative 43.2 12.0 - 46.0 %   Monocytes Relative 7.9 3.0 - 12.0 %   Eosinophils Relative 3.7 0.0 - 5.0 %   Basophils Relative 0.7 0.0 - 3.0 %   Neutro Abs 2.7 1.4 - 7.7 K/uL   Lymphs Abs 2.6 0.7 - 4.0 K/uL   Monocytes Absolute 0.5 0.1 - 1.0 K/uL   Eosinophils Absolute 0.2 0.0 - 0.7 K/uL   Basophils Absolute 0.0 0.0 - 0.1 K/uL     The following were reviewed and entered/updated in epic: Past Medical History:  Diagnosis Date   ADD (attention deficit disorder)    Asthma    History of substance use disorder 10/23/2022   Substance induced mood disorder ([place]) 10/23/2022   Although he reports sobriety and extensive prior rehab prior to 10/23/22 depression screen:    10/23/2022   10:14 AM 09/02/2020    9:34 AM 06/15/2020    2:46 PM PHQ9 SCORE ONLY PHQ-9 Total Score 23 0 0   He reports that substances caused the depression.   History reviewed. No pertinent surgical history. History reviewed. No pertinent surgical history. Family Status  Relation Name Status   Mother  Alive   Father  Alive   MGM  Alive   MGF  Alive   PGM  Alive   PGF  Alive   Family History  Problem Relation Age of Onset   Diabetes Maternal  Grandmother    Outpatient Medications Prior to Visit  Medication Sig Dispense Refill   Multiple Vitamins-Minerals (MULTIVITAMIN WITH MINERALS) tablet Take 1 tablet by mouth daily.     OVER THE COUNTER MEDICATION Take 50 mg by mouth daily. Serotonin 50 mg     QUEtiapine (SEROQUEL) 100 MG tablet Take 100 mg by mouth at bedtime.     albuterol (VENTOLIN HFA) 108 (90 Base) MCG/ACT inhaler Inhale 2 puffs into the lungs every 4 (four) hours as needed for wheezing or shortness of breath. (Patient not taking: Reported on 08/08/2022) 18 g 3   EPINEPHrine 0.3 mg/0.3 mL IJ SOAJ injection Inject 0.3 mLs (0.3 mg total) into the muscle as needed for anaphylaxis. (Patient not taking: Reported on 04/27/2020) 1 each 2   amphetamine-dextroamphetamine (ADDERALL) 20 MG tablet Take 1 tablet (20 mg total) by mouth 2 (two) times daily. (Patient not taking: Reported on 08/08/2022) 60 tablet 0   No facility-administered medications prior to visit.    No Known Allergies Social History   Tobacco Use   Smoking status: Every Day    Packs/day: 0.25    Years: 6.00    Total pack years: 1.50    Types: Cigarettes    Last attempt to quit: 08/31/2018    Years since quitting: 4.1   Smokeless tobacco: Current    Types: Chew  Vaping Use   Vaping Use: Never used  Substance Use Topics   Alcohol use: No    Alcohol/week: 0.0 standard drinks of alcohol   Drug use:  Yes    Types: IV    Immunization History  Administered Date(s) Administered   Hepatitis A, Adult 10/23/2022   Hepb-cpg 10/23/2022   Moderna Sars-Covid-2 Vaccination 03/24/2020   PFIZER(Purple Top)SARS-COV-2 Vaccination 04/21/2020      Objective:  BP 126/84 (BP Location: Right Arm, Patient Position: Sitting)   Pulse 61   Temp 98.1 F (36.7 C) (Temporal)   Resp 14   Ht _0  (1.753 m)   Wt 227 lb (103 kg)   SpO2 100%   BMI 33.52 kg/m  Body mass index is 33.52 kg/m.  He  is a very cordial and polite person who was a pleasure to meet.  Gen: NAD, resting  comfortably, HEENT: Mucous membranes are moist. Sclera conjunctiva and lids grossly normal Neck: no thyromegaly, no cervical lymphadenopathy Ext: no edema Skin: warm, dry Neuro: grossly intact  No images are attached to the encounter or orders placed in the encounter.  Results for orders placed or performed in visit on 10/23/22  Comp Met (CMET)  Result Value Ref Range   Sodium 140 135 - 145 mEq/L   Potassium 4.0 3.5 - 5.1 mEq/L   Chloride 103 96 - 112 mEq/L   CO2 29 19 - 32 mEq/L   Glucose, Bld 98 70 - 99 mg/dL   BUN 14 6 - 23 mg/dL   Creatinine, Ser 0.86 0.40 - 1.50 mg/dL   Total Bilirubin 0.5 0.2 - 1.2 mg/dL   Alkaline Phosphatase 113 39 - 117 U/L   AST 22 0 - 37 U/L   ALT 32 0 - 53 U/L   Total Protein 7.6 6.0 - 8.3 g/dL   Albumin 4.7 3.5 - 5.2 g/dL   GFR 119.94 >60.00 mL/min   Calcium 9.8 8.4 - 10.5 mg/dL  CBC w/Diff  Result Value Ref Range   WBC 6.1 4.0 - 10.5 K/uL   RBC 5.20 4.22 - 5.81 Mil/uL   Hemoglobin 15.9 13.0 - 17.0 g/dL   HCT 47.3 39.0 - 52.0 %   MCV 91.0 78.0 - 100.0 fl   MCHC 33.5 30.0 - 36.0 g/dL   RDW 13.3 11.5 - 15.5 %   Platelets 240.0 150.0 - 400.0 K/uL   Neutrophils Relative % 44.5 43.0 - 77.0 %   Lymphocytes Relative 43.2 12.0 - 46.0 %   Monocytes Relative 7.9 3.0 - 12.0 %   Eosinophils Relative 3.7 0.0 - 5.0 %   Basophils Relative 0.7 0.0 - 3.0 %   Neutro Abs 2.7 1.4 - 7.7 K/uL   Lymphs Abs 2.6 0.7 - 4.0 K/uL   Monocytes Absolute 0.5 0.1 - 1.0 K/uL   Eosinophils Absolute 0.2 0.0 - 0.7 K/uL   Basophils Absolute 0.0 0.0 - 0.1 K/uL

## 2022-10-23 NOTE — Patient Instructions (Addendum)
It was a pleasure seeing you today!  Today the plan is... Today the plan is... check labs, get hep a and b vaccine protection, start using [facility] for support, continue 12 steps, increase zoloft/sertraline 100->200 daily (takes weeks to benefit), try trazodone to replace seroquel/quetiapine for insomnia, because oxcarbazepine / trileptal is getting restarted and I dont like how it plays with quetiapine, but I like that its helped your mood in past and it is a mood stabilizer and it should help while we waiting on zoloft boost to take effect  Your mom asked about trileptal side effects which I said were many but weight gain is one.  Also you should be aware that  Trileptal is an an anticonvulsant or antiepileptic medicine. It works by decreasing nerve impulses that cause seizures and pain. But this also causes it to work as a mood stabilizer.   Do not stop using Trileptal suddenly.  Seek medical treatment if you have symptoms of a serious drug reaction: skin rash, fever, swollen glands, flu-like symptoms, unusual bruising or bleeding, or yellowing of your skin or eyes. Call your doctor right away if you have symptoms of low sodium levels in your body, such as nausea, confusion, severe weakness, muscle pain, or seizure like activity.   History of substance use disorder Assessment & Plan: In remission and well supported I recommend continuing treatment with myself a psychiatrist and further medication adjustment as the this disorder is comorbid with severe depression but he denies any suicide risk and commits to safety Already has Narcan available and declines for me to send in any Alcohol is not a problem and so I do not see any medications for that Main concerns are stimulants and opioids- I made sure that his mother knows how to use Narcan prescribed Narcan to him so he could put it in the custody of his mother and other family support also advised to learn CPR and how to use because there is still high  risk until he has been sober for much longer I used this information to encourage him to consider naltrexone, suboxone, or methadone and/or contingency management- but he declined all of these interventions at this time- he is willing to meet again and discuss more in 2 weeks though so I"m thankful for his interest in continued medical mgmt of this issue/concern I encouraged 12 step  And sponsor participation- which he reports he does. I encouraged using [facility] and [place] therapy and referred.   Orders: -     Hepatitis A vaccine adult IM -     Heplisav-B (HepB-CPG) Vaccine  Preventative health care -     Comprehensive metabolic panel -     CBC with Differential/Platelet -     Hepatitis A vaccine adult IM -     Heplisav-B (HepB-CPG) Vaccine  Screening examination for infectious disease  Obesity due to energy imbalance  IVDU (intravenous drug user) -     Hepatitis C antibody -     HIV Antibody (routine testing w rflx) -     Hepatitis A vaccine adult IM -     Heplisav-B (HepB-CPG) Vaccine  Major depressive disorder with current active episode, unspecified depression episode severity, unspecified whether recurrent -     Sertraline HCl; Take 2 tablets (200 mg total) by mouth daily. Replaces 100 mg daily dosing and increase the dose by increasing to 150 mg daily for 2 weeks and then increasing to 200 mg daily thereafter do not suddenly stop taking this medicine  Dispense: 30 tablet; Refill: 3 -     Ambulatory referral to Psychology -     OXcarbazepine  Substance induced mood disorder (HCC) -     Hepatitis A vaccine adult IM -     Heplisav-B (HepB-CPG) Vaccine  Other insomnia -     traZODone HCl; Take 0.5-1 tablets (25-50 mg total) by mouth at bedtime as needed for sleep. To enable reduced seroquel / quetiapine use, hopefully replaces  Dispense: 30 tablet; Refill: 3    Loralee Pacas, MD   Return in about 2 weeks (around 11/06/2022) for review response to recent changes.   - If your  condition fails to resolve or you have other questions / concerns: please contact me via phone [phone] or MyChart messaging.  - Please bring all your medicines to your next appointment. This is the best way for me to know exactly what you're taking.  - If your condition begins to worsen or become severe:  go to the ER.   IF you received an x-ray today, you will receive an invoice from [facility]. Please contact [facility] at [phone] with questions or concerns regarding your invoice.    IF you received labwork today, you will receive an invoice from [facility]. Please contact [facility] at [phone] with questions or concerns regarding your invoice.    Our billing staff will not be able to assist you with questions regarding bills from these companies.   --------------------------------------------------------------------------------------------------------------------  You will be contacted with the lab results as soon as they are available. The fastest way to get your results is to activate your My Chart account. Instructions are located on the last page of this paperwork. If you have not heard from [place] regarding the results in 2 weeks, please contact this office. For any labs or imaging tests, we will call you if the results are significantly abnormal.  Most normal results will be posted to myChart as soon as they are available and I will comment on them there within 2-3 business days.

## 2022-10-23 NOTE — Assessment & Plan Note (Addendum)
In remission and well supported I recommend continuing treatment with myself a psychiatrist and further medication adjustment as the this disorder is comorbid with severe depression but he denies any suicide risk and commits to safety Already has Narcan available and declines for me to send in any Alcohol is not a problem and so I do not see any medications for that Main concerns are stimulants and opioids- I made sure that his mother knows how to use Narcan prescribed Narcan to him so he could put it in the custody of his mother and other family support also advised to learn CPR and how to use because there is still high risk until he has been sober for much longer I used this information to encourage him to consider naltrexone, suboxone, or methadone and/or contingency management- but he declined all of these interventions at this time- he is willing to meet again and discuss more in 2 weeks though so I"m thankful for his interest in continued medical mgmt of this issue/concern I encouraged 12 step  And sponsor participation- which he reports he does. I encouraged using [facility] and [place] therapy and referred.

## 2022-10-24 LAB — HEPATITIS C ANTIBODY: Hepatitis C Ab: NONREACTIVE

## 2022-10-24 LAB — HIV ANTIBODY (ROUTINE TESTING W REFLEX): HIV 1&2 Ab, 4th Generation: NONREACTIVE

## 2022-11-06 ENCOUNTER — Ambulatory Visit (INDEPENDENT_AMBULATORY_CARE_PROVIDER_SITE_OTHER): Payer: Self-pay | Admitting: Internal Medicine

## 2022-11-06 ENCOUNTER — Encounter: Payer: Self-pay | Admitting: Internal Medicine

## 2022-11-06 VITALS — BP 125/85 | HR 64 | Temp 98.4°F | Resp 14 | Ht 69.0 in | Wt 227.0 lb

## 2022-11-06 DIAGNOSIS — F1994 Other psychoactive substance use, unspecified with psychoactive substance-induced mood disorder: Secondary | ICD-10-CM

## 2022-11-06 DIAGNOSIS — Z87898 Personal history of other specified conditions: Secondary | ICD-10-CM

## 2022-11-06 DIAGNOSIS — G4709 Other insomnia: Secondary | ICD-10-CM

## 2022-11-06 MED ORDER — TRAZODONE HCL 100 MG PO TABS: 100.0000 mg | ORAL_TABLET | Freq: Every day | ORAL | 3 refills | Status: DC

## 2022-11-06 MED ORDER — OXCARBAZEPINE 150 MG PO TABS: 150.0000 mg | ORAL_TABLET | Freq: Two times a day (BID) | ORAL | 30 refills | Status: DC

## 2022-11-06 NOTE — Assessment & Plan Note (Addendum)
Reports he is doing outstanding and recovery 30 days sober from all illicit substances. A somehow failed to send the oxcarbazepine and he would like it so we are resending that today The trazodone was very helpful and he was able to stop Seroquel without sleep problems flaring so organ to continue that he would like the dose increased so we will adjusted upwards  I explained that in the opinion he needs to focus on developing skills and gratitude exercises mindfulness goalsetting exercise and sleep is key areas of focus to prevent himself from having a lot of cravings and returning to use.  I also encouraged continued participation with the 12-step program and really good relationship with his sponsor and avoiding taking calls from people that we will draw him back into the world he is trying to escape Psych and [facility] referrals made but he has not been able to get in with either of them yet  I offered referral and discussion of the available substance use focused medical programs in the area he is not interested at this time.  I sent in the Trileptal and increase the trazodone at his request

## 2022-11-06 NOTE — Patient Instructions (Signed)
It was a pleasure seeing you today! I truly hope you feel like you received 5 star service and please let me know if there is anything I can improve.  Amily Depp G Nanako Stopher, MD   Today the plan is...  There are no diagnoses linked to this encounter.     [x] No follow-ups on file.   [x] If your condition begins to worsen or become severe:  GO to the ER  [x] If you are not doing well: RETURN to the office sooner  [x] If you have follow-up questions / concerns: please contact me  -via phone [phone] OR MyChart messaging   [x] Please bring all your medicines to each appointment  [x]  You will be contacted with the lab results as soon as they are available. For any labs or imaging tests, we will call you if the results are significantly abnormal.  Most normal results will be posted to myChart as soon as they are available and I will comment on them there within 2-3 business days.  [x] Billing questions for xray and lab orders are billed from separate companies and questions/concerns should be directed to the invoicing company.  For visit charges please discuss with our administrative services.  [x] You will be contacted with the lab results as soon as they are available. The fastest way to get your results is to activate your My Chart account. Instructions are located on the last page of this paperwork. If you have not heard from us regarding the results in 2 weeks, please contact this office. 

## 2022-11-06 NOTE — Assessment & Plan Note (Signed)
Declines referral to rehab center [facility] and psych pending Declines naltrexone treatment although it was strongly encouraged Reinforeced 12 steps and sponsor follow up .

## 2022-11-06 NOTE — Progress Notes (Signed)
[facility] [employer]:  [phone]  Routine Medical Office Visit  Patient:  Mitchell Nelson      Age: 27 y.o.       Sex:  male  Date:   11/06/2022  PCP:    Loralee Pacas, MD    [facility] Provider: Loralee Pacas, MD  Assessment/Plan:   Axxel was seen today for 2 week follow-up.  Substance induced mood disorder ([place]) Overview: Although he reports sobriety and extensive prior rehab prior to 10/23/22 depression screen:    10/23/2022   10:14 AM 09/02/2020    9:34 AM 06/15/2020    2:46 PM  PHQ9 SCORE ONLY  PHQ-9 Total Score 23 0 0    He reports that substances caused the depression.  Assessment & Plan: Reports he is doing outstanding and recovery 30 days sober from all illicit substances. A somehow failed to send the oxcarbazepine and he would like it so we are resending that today The trazodone was very helpful and he was able to stop Seroquel without sleep problems flaring so organ to continue that he would like the dose increased so we will adjusted upwards  I explained that in the opinion he needs to focus on developing skills and gratitude exercises mindfulness goalsetting exercise and sleep is key areas of focus to prevent himself from having a lot of cravings and returning to use.  I also encouraged continued participation with the 12-step program and really good relationship with his sponsor and avoiding taking calls from people that we will draw him back into the world he is trying to escape Psych and bh referrals made but he has not been able to get in with either of them yet  I offered referral and discussion of the available substance use focused medical programs in the area he is not interested at this time.  I sent in the Trileptal and increase the trazodone at his request   Orders: -     OXcarbazepine; Take 1 tablet (150 mg total) by mouth 2 (two) times daily.  Dispense: 180 tablet; Refill: 30  Other insomnia -      traZODone HCl; Take 1 tablet (100 mg total) by mouth at bedtime.  Dispense: 90 tablet; Refill: 3     No follow-ups on file.   Today's key discussion points - also in After Visit Summary (AVS) Common side effects, risks, benefits, and alternatives for medications and treatment plan prescribed today were discussed, and he expressed understanding of the given instructions.  Medication list was reconciled and patient instructions and summary information was documented and made available for him to review in the AVS (see AVS).  This note is also available to patient for review for accuracy and understanding. He was encouraged to contact our office by phone or message via MyChart if he has any questions or concerns regarding our treatment plan (see AVS).    Subjective:   Mitchell Nelson is a 27 y.o. male with PMH significant for: Past Medical History:  Diagnosis Date   ADD (attention deficit disorder)    Asthma    History of substance use disorder 10/23/2022   Substance induced mood disorder ([place]) 10/23/2022   Although he reports sobriety and extensive prior rehab prior to 10/23/22 depression screen:    10/23/2022   10:14 AM 09/02/2020    9:34 AM 06/15/2020    2:46 PM PHQ9 SCORE ONLY PHQ-9 Total Score 23 0 0   He reports that substances caused  the depression.     He is presenting today with: Chief Complaint  Patient presents with   2 Week Follow-up    Everything is about the same.     Additional physician collected history: See Assessment/Plan section for per problem updates to history (overview and a/p subsections) as reported by patient today. Confirmed mom/sister know how to use narcan States he is staying sober 30days- might go to meeting to get coin Trileptal wasn't sent in last time as planned Trazodone was and he tapered off seroquel Reports he still not really enjoying life... recovery support peers are all in [place].        Objective:  Physical Exam: BP 125/85 (BP  Location: Left Arm, Patient Position: Sitting)   Pulse 64   Temp 98.4 F (36.9 C) (Temporal)   Resp 14   Ht _0  (1.753 m)   Wt 227 lb (103 kg)   SpO2 98%   BMI 33.52 kg/m   He is a polite, friendly, and genuine person Constitutional: NAD, AAO, not ill-appearing Neuro: alert, no focal deficit obvious, articulate speech Psych: normal mood, behavior, thought content  Problem specific physical exam findings:  Doesn't seem to be uncomfortable, distressed, or in withdrawal    Results: No results found for any visits on 11/06/22.   Recent Results (from the past 2160 hour(s))  Urine rapid drug screen (hosp performed)     Status: Abnormal   Collection Time: 08/08/22 12:44 PM  Result Value Ref Range   Opiates POSITIVE (A) NONE DETECTED   Cocaine POSITIVE (A) NONE DETECTED   Benzodiazepines NONE DETECTED NONE DETECTED   Amphetamines POSITIVE (A) NONE DETECTED   Tetrahydrocannabinol POSITIVE (A) NONE DETECTED   Barbiturates NONE DETECTED NONE DETECTED    Comment: (NOTE) DRUG SCREEN FOR MEDICAL PURPOSES ONLY.  IF CONFIRMATION IS NEEDED FOR ANY PURPOSE, NOTIFY LAB WITHIN 5 DAYS.  LOWEST DETECTABLE LIMITS FOR URINE DRUG SCREEN Drug Class                     Cutoff (ng/mL) Amphetamine and metabolites    1000 Barbiturate and metabolites    200 Benzodiazepine                 637 Tricyclics and metabolites     300 Opiates and metabolites        300 Cocaine and metabolites        300 THC                            50 Performed at [facility], [place] [address], [place], [place] [address]   Comp Met (CMET)     Status: None   Collection Time: 10/23/22 11:27 AM  Result Value Ref Range   Sodium 140 135 - 145 mEq/L   Potassium 4.0 3.5 - 5.1 mEq/L   Chloride 103 96 - 112 mEq/L   CO2 29 19 - 32 mEq/L   Glucose, Bld 98 70 - 99 mg/dL   BUN 14 6 - 23 mg/dL   Creatinine, Ser 0.86 0.40 - 1.50 mg/dL   Total Bilirubin 0.5 0.2 - 1.2 mg/dL   Alkaline Phosphatase 113 39 -  117 U/L   AST 22 0 - 37 U/L   ALT 32 0 - 53 U/L   Total Protein 7.6 6.0 - 8.3 g/dL   Albumin 4.7 3.5 - 5.2 g/dL   GFR 119.94 >60.00 mL/min  Comment: Calculated using the CKD-EPI Creatinine Equation (2021)   Calcium 9.8 8.4 - 10.5 mg/dL  CBC w/Diff     Status: None   Collection Time: 10/23/22 11:27 AM  Result Value Ref Range   WBC 6.1 4.0 - 10.5 K/uL   RBC 5.20 4.22 - 5.81 Mil/uL   Hemoglobin 15.9 13.0 - 17.0 g/dL   HCT 47.3 39.0 - 52.0 %   MCV 91.0 78.0 - 100.0 fl   MCHC 33.5 30.0 - 36.0 g/dL   RDW 13.3 11.5 - 15.5 %   Platelets 240.0 150.0 - 400.0 K/uL   Neutrophils Relative % 44.5 43.0 - 77.0 %   Lymphocytes Relative 43.2 12.0 - 46.0 %   Monocytes Relative 7.9 3.0 - 12.0 %   Eosinophils Relative 3.7 0.0 - 5.0 %   Basophils Relative 0.7 0.0 - 3.0 %   Neutro Abs 2.7 1.4 - 7.7 K/uL   Lymphs Abs 2.6 0.7 - 4.0 K/uL   Monocytes Absolute 0.5 0.1 - 1.0 K/uL   Eosinophils Absolute 0.2 0.0 - 0.7 K/uL   Basophils Absolute 0.0 0.0 - 0.1 K/uL  Hepatitis C Antibody     Status: None   Collection Time: 10/23/22 11:27 AM  Result Value Ref Range   Hepatitis C Ab NON-REACTIVE NON-REACTIVE    Comment: . HCV antibody was non-reactive. There is no laboratory  evidence of HCV infection. . In most cases, no further action is required. However, if recent HCV exposure is suspected, a test for HCV RNA (test code 2017056159) is suggested. . For additional information please refer to http://education.questdiagnostics.com/faq/FAQ22v1 (This link is being provided for informational/ educational purposes only.) .   HIV antibody (with reflex)     Status: None   Collection Time: 10/23/22 11:27 AM  Result Value Ref Range   HIV 1&2 Ab, 4th Generation NON-REACTIVE NON-REACTIVE    Comment: HIV-1 antigen and HIV-1/HIV-2 antibodies were not detected. There is no laboratory evidence of HIV infection. Marland Kitchen PLEASE NOTE: This information has been disclosed to you from records whose confidentiality may  be protected by state law.  If your state requires such protection, then the state law prohibits you from making any further disclosure of the information without the specific written consent of the person to whom it pertains, or as otherwise permitted by law. A general authorization for the release of medical or other information is NOT sufficient for this purpose. . For additional information please refer to http://education.questdiagnostics.com/faq/FAQ106 (This link is being provided for informational/ educational purposes only.) . Marland Kitchen The performance of this assay has not been clinically validated in patients less than 43 years old. .          No images are attached to the encounter.

## 2022-12-06 ENCOUNTER — Ambulatory Visit (INDEPENDENT_AMBULATORY_CARE_PROVIDER_SITE_OTHER): Payer: Self-pay | Admitting: Internal Medicine

## 2022-12-06 ENCOUNTER — Encounter: Payer: Self-pay | Admitting: Internal Medicine

## 2022-12-06 VITALS — BP 137/88 | HR 70 | Temp 98.2°F | Resp 12 | Ht 69.0 in | Wt 218.8 lb

## 2022-12-06 DIAGNOSIS — F988 Other specified behavioral and emotional disorders with onset usually occurring in childhood and adolescence: Secondary | ICD-10-CM

## 2022-12-06 DIAGNOSIS — Z79899 Other long term (current) drug therapy: Secondary | ICD-10-CM | POA: Insufficient documentation

## 2022-12-06 DIAGNOSIS — F1190 Opioid use, unspecified, uncomplicated: Secondary | ICD-10-CM

## 2022-12-06 HISTORY — DX: Other long term (current) drug therapy: Z79.899

## 2022-12-06 MED ORDER — LISDEXAMFETAMINE DIMESYLATE 20 MG PO CAPS: 20.0000 mg | ORAL_CAPSULE | Freq: Every day | ORAL | Status: DC

## 2022-12-06 NOTE — Assessment & Plan Note (Addendum)
I agreed to resume Adderall with tight controls- no as needed dosing, must be daily, must be available for pill counts (mother will help administer). However, I want to try to get Vyvanse generic first as it is even more difficult to abuse than Adderall XR. I'm comfortable using these despite history substance use disorder due to he is in remission and agreeable for close monitoring that he is staying sober.

## 2022-12-06 NOTE — Progress Notes (Addendum)
[name]: [phone]   Routine Medical Office Visit  Patient:  Mitchell Nelson      Age: 27 y.o.       Sex:  male  Date:   12/07/2022  PCP:    Loralee Pacas, MD   [facility] Provider: Loralee Pacas, MD  Assessment/Plan:   This is a really intelligent gentleman who seems to be doing great in recovery from opioid use disorder that has mainly been achieved due to outstanding family support.  Unfortunately he is averse to using Vivitrol or Suboxone for safety- but he does have narcan.   He is going back to school and working a job and would like to resume his attention deficit disorder medication Adderall if possible now. He is going to do psychology  Mitchell Nelson was seen today for 1 month follow-up.  Attention deficit disorder, unspecified hyperactivity presence Overview: History of being treated with Adderall suspended for opioid use disorder that is now in remission  Assessment & Plan: I agreed to resume Adderall with tight controls- no as needed dosing, must be daily, must be available for pill counts (mother will help administer). However, I want to try to get Vyvanse generic first as it is even more difficult to abuse than Adderall XR. I'm comfortable using these despite history substance use disorder due to he is in remission and agreeable for close monitoring that he is staying sober.   Orders: -     Lisdexamfetamine Dimesylate; Take 1 capsule (20 mg total) by mouth daily.  Dispense: 30 capsule; Refill: 0  High risk medication use Overview: Indication for controlled substance: add Medication and dose: see associated problem and med list # pills per month: 30 Last UDS date: 12/06/22 Controlled substance contract signed (Y/N): Yes, scanned  PDMP last reviewed (include red flags): PDMP reviewed during this encounter.  Diversion Prevention:  warned of risk of discontinuation with legal or substance use issues, signed contract,  intermittent random drug screens and pill counts   Interim history: started Vyvanse to minimize risk misuse of stimulant and got support from his mother to administer it.      Opioid use disorder Overview: Tried and yet again declined for starting Suboxone or Vivitrol for treatment of his opioid use disorder I also counseled him for 5 to 10 minutes on how this may disorder is a lifelong problem that will not go away and he should continue to do follow-up with some sort of treatment plan rather than just stay sober without any support Controlled substance contract signed (Y/N): Yes, scanned  He also follows with a sponsor which I's encouraged him to keep doing as well as continue to make use of his outstanding family support  Interim history:      Controlled substance agreement signed -     DRUG MONITOR, PANEL 5, SCREEN, URINE    Return in about 1 month (around 01/06/2023) for review response to recent changes.   Today's key discussion points and After Visit Summary (AVS) reminders.  He was encouraged to contact our office by phone or message via MyChart if he has any questions or concerns regarding our treatment plan (see AVS). He was given an opportunity to ask questions/clarifications about any aspect of the diagnosis and treatment plan at today's visit. Common side effects, risks, benefits, and alternatives for medications and treatment plan prescribed today were discussed. He expressed understanding of the given instructions.  No barriers to understanding were identified for him, but his  supportive mother was with him and she has limited understanding of english.    Subjective:   Mitchell Nelson is a 27 y.o. male with past medical history including: Past Medical History:  Diagnosis Date   ADD (attention deficit disorder)    Asthma    History of substance use disorder 10/23/2022   Substance induced mood disorder ([place]) 10/23/2022   Although he reports sobriety and  extensive prior rehab prior to 10/23/22 depression screen:    10/23/2022   10:14 AM 09/02/2020    9:34 AM 06/15/2020    2:46 PM PHQ9 SCORE ONLY PHQ-9 Total Score 23 0 0   He reports that substances caused the depression.     Listed as currently taking:  Current Outpatient Medications:    lisdexamfetamine (VYVANSE) 20 MG capsule, Take 1 capsule (20 mg total) by mouth daily., Disp: 30 capsule, Rfl: 0   Multiple Vitamins-Minerals (MULTIVITAMIN WITH MINERALS) tablet, Take 1 tablet by mouth daily., Disp: , Rfl:    OXcarbazepine (TRILEPTAL) 150 MG tablet, Take 1 tablet (150 mg total) by mouth 2 (two) times daily., Disp: 180 tablet, Rfl: 30   sertraline (ZOLOFT) 100 MG tablet, Take 2 tablets (200 mg total) by mouth daily. Replaces 100 mg daily dosing and increase the dose by increasing to 150 mg daily for 2 weeks and then increasing to 200 mg daily thereafter do not suddenly stop taking this medicine, Disp: 30 tablet, Rfl: 3   traZODone (DESYREL) 100 MG tablet, Take 1 tablet (100 mg total) by mouth at bedtime., Disp: 90 tablet, Rfl: 3   albuterol (VENTOLIN HFA) 108 (90 Base) MCG/ACT inhaler, Inhale 2 puffs into the lungs every 4 (four) hours as needed for wheezing or shortness of breath. (Patient not taking: Reported on 08/08/2022), Disp: 18 g, Rfl: 3   EPINEPHrine 0.3 mg/0.3 mL IJ SOAJ injection, Inject 0.3 mLs (0.3 mg total) into the muscle as needed for anaphylaxis. (Patient not taking: Reported on 04/27/2020), Disp: 1 each, Rfl: 2   OVER THE COUNTER MEDICATION, Take 50 mg by mouth daily. Serotonin 50 mg (Patient not taking: Reported on 11/06/2022), Disp: , Rfl:    QUEtiapine (SEROQUEL) 100 MG tablet, Take 100 mg by mouth at bedtime. (Patient not taking: Reported on 11/06/2022), Disp: , Rfl:   Current Facility-Administered Medications:    OXcarbazepine (TRILEPTAL) tablet 150 mg, 150 mg, Oral, BID, Loralee Pacas, MD    He presented today reporting reason for visit as: Chief Complaint  Patient presents  with   1 month follow-up    Everything has improved.     Problem focused charting was used to record today's medical interview as follows: Problem  Add (Attention Deficit Disorder)   History of being treated with Adderall suspended for opioid use disorder that is now in remission             Objective:  Physical Exam: BP 137/88 (BP Location: Left Arm, Patient Position: Sitting)   Pulse 70   Temp 98.2 F (36.8 C) (Temporal)   Resp 12   Ht _0  (1.753 m)   Wt 218 lb 12.8 oz (99.2 kg)   SpO2 98%   BMI 32.31 kg/m   Physical Exam  Problem-specific physical exam findings:  He looks well put together, very clean and groomed, no miosis or somnolence or speech slurring.   Results Reviewed:  No results found for any visits on 12/06/22.   Recent Results (from the past 2160 hour(s))  Comp Met (CMET)  Status: None   Collection Time: 10/23/22 11:27 AM  Result Value Ref Range   Sodium 140 135 - 145 mEq/L   Potassium 4.0 3.5 - 5.1 mEq/L   Chloride 103 96 - 112 mEq/L   CO2 29 19 - 32 mEq/L   Glucose, Bld 98 70 - 99 mg/dL   BUN 14 6 - 23 mg/dL   Creatinine, Ser 0.86 0.40 - 1.50 mg/dL   Total Bilirubin 0.5 0.2 - 1.2 mg/dL   Alkaline Phosphatase 113 39 - 117 U/L   AST 22 0 - 37 U/L   ALT 32 0 - 53 U/L   Total Protein 7.6 6.0 - 8.3 g/dL   Albumin 4.7 3.5 - 5.2 g/dL   GFR 119.94 >60.00 mL/min    Comment: Calculated using the CKD-EPI Creatinine Equation (2021)   Calcium 9.8 8.4 - 10.5 mg/dL  CBC w/Diff     Status: None   Collection Time: 10/23/22 11:27 AM  Result Value Ref Range   WBC 6.1 4.0 - 10.5 K/uL   RBC 5.20 4.22 - 5.81 Mil/uL   Hemoglobin 15.9 13.0 - 17.0 g/dL   HCT 47.3 39.0 - 52.0 %   MCV 91.0 78.0 - 100.0 fl   MCHC 33.5 30.0 - 36.0 g/dL   RDW 13.3 11.5 - 15.5 %   Platelets 240.0 150.0 - 400.0 K/uL   Neutrophils Relative % 44.5 43.0 - 77.0 %   Lymphocytes Relative 43.2 12.0 - 46.0 %   Monocytes Relative 7.9 3.0 - 12.0 %   Eosinophils Relative 3.7 0.0 - 5.0  %   Basophils Relative 0.7 0.0 - 3.0 %   Neutro Abs 2.7 1.4 - 7.7 K/uL   Lymphs Abs 2.6 0.7 - 4.0 K/uL   Monocytes Absolute 0.5 0.1 - 1.0 K/uL   Eosinophils Absolute 0.2 0.0 - 0.7 K/uL   Basophils Absolute 0.0 0.0 - 0.1 K/uL  Hepatitis C Antibody     Status: None   Collection Time: 10/23/22 11:27 AM  Result Value Ref Range   Hepatitis C Ab NON-REACTIVE NON-REACTIVE    Comment: . HCV antibody was non-reactive. There is no laboratory  evidence of HCV infection. . In most cases, no further action is required. However, if recent HCV exposure is suspected, a test for HCV RNA (test code [phone]) is suggested. . For additional information please refer to http://education.questdiagnostics.com/faq/FAQ22v1 (This link is being provided for informational/ educational purposes only.) .   HIV antibody (with reflex)     Status: None   Collection Time: 10/23/22 11:27 AM  Result Value Ref Range   HIV 1&2 Ab, 4th Generation NON-REACTIVE NON-REACTIVE    Comment: HIV-1 antigen and HIV-1/HIV-2 antibodies were not detected. There is no laboratory evidence of HIV infection. Marland Kitchen PLEASE NOTE: This information has been disclosed to you from records whose confidentiality may be protected by state law.  If your state requires such protection, then the state law prohibits you from making any further disclosure of the information without the specific written consent of the person to whom it pertains, or as otherwise permitted by law. A general authorization for the release of medical or other information is NOT sufficient for this purpose. . For additional information please refer to http://education.questdiagnostics.com/faq/FAQ106 (This link is being provided for informational/ educational purposes only.) . Marland Kitchen The performance of this assay has not been clinically validated in patients less than 73 years old. Loralee Pacas, MD

## 2022-12-06 NOTE — Patient Instructions (Addendum)
It was a pleasure seeing you today!  Your health and satisfaction are my top priorities. If you believe your experience today was worthy of a 5-star rating, I'd be grateful for your feedback! Lula Olszewski, MD   CHECKOUT CHECKLIST  []    Schedule next appointment(s):    Return in about 1 month (around 01/06/2023) for review response to recent changes.  Any requested lab visits should be scheduled as appointments too  If you are not doing well:  Return to the office sooner Please bring all your medicine bottles to each appointment If your condition begins to worsen or become severe:  go to the emergency room or even call 911  []    Sign release of information authorizations: Any records we need for your care and to be your medical home  REMINDERS:  []    Call 03/07/2023 if you have trouble getting the medication at the pharmacy encourage the pharmacy to get prior authorization paperwork asap if its needed  []   (Optional):  Review your clinical notes on MyChart after they are completed.     Today's draft of the physician documented plan for today's visit: (final revisions will be visible on MyChart chart later) Attention deficit disorder, unspecified hyperactivity presence Assessment & Plan: I agreed to resume Adderall with tight controls- no as needed dosing, must be daily, must be available for pill counts (mother will help administer).  Orders: -     Lisdexamfetamine Dimesylate  High risk medication use  Opioid use disorder      QUESTIONS & CONCERNS: CLINICAL: please contact via phone [phone] OR MyChart messaging  LAB & IMAGING:   We will call you if the results are significantly abnormal or you don't use MyChart.  Most normal results will be posted to MyChart immediately and have a clinical review message by Dr. [place] posted within 2-3 business days.   If you have not heard from regarding the results in 2 weeks OR if you need priority reporting, please contact this  office. MYCHART:  The fastest way to get your results and easiest way to stay in touch with [place] is by activating your My Chart account. Instructions are located on the last page of this paperwork.  BILLING: xray and lab orders are billed from separate companies and questions./concerns should be directed to the invoicing company.  For visit charges please discuss with our administrative services COMPLAINTS:  please let Dr. [phone] know or see the [facility] Practice Administrator - [place], by asking at the front desk: we want you to be satisfied with every experience and we would be grateful for the opportunity to address any problems

## 2022-12-06 NOTE — Addendum Note (Signed)
Addended by: Donzetta Starch on: 12/06/2022 11:30 AM   Modules accepted: Orders

## 2022-12-07 ENCOUNTER — Other Ambulatory Visit: Payer: Self-pay

## 2022-12-07 ENCOUNTER — Encounter: Payer: Self-pay | Admitting: Internal Medicine

## 2022-12-07 LAB — DRUG MONITOR, PANEL 5, SCREEN, URINE
Amphetamines: NEGATIVE ng/mL (ref <500)
Barbiturates: NEGATIVE ng/mL (ref <300)
Benzodiazepines: NEGATIVE ng/mL (ref <100)
Cocaine Metabolite: NEGATIVE ng/mL (ref <150)
Creatinine: >300 mg/dL (ref >=20.0)
Marijuana Metabolite: POSITIVE ng/mL — AB (ref <20)
Methadone Metabolite: NEGATIVE ng/mL (ref <100)
Opiates: NEGATIVE ng/mL (ref <100)
Oxidant: NEGATIVE ug/mL (ref <200)
Oxycodone: NEGATIVE ng/mL (ref <100)
pH: 6.1 (ref 4.5–9.0)

## 2022-12-07 LAB — DM TEMPLATE

## 2022-12-07 MED ORDER — LISDEXAMFETAMINE DIMESYLATE 20 MG PO CAPS: 20.0000 mg | ORAL_CAPSULE | Freq: Every day | ORAL | 0 refills | Status: DC

## 2022-12-07 NOTE — Addendum Note (Signed)
Addended by: Lula Olszewski on: 12/07/2022 08:09 AM   Modules accepted: Orders

## 2022-12-08 MED ORDER — LISDEXAMFETAMINE DIMESYLATE 20 MG PO CAPS: 20.0000 mg | ORAL_CAPSULE | Freq: Every day | ORAL | 0 refills | Status: DC

## 2022-12-18 ENCOUNTER — Other Ambulatory Visit: Payer: Self-pay | Admitting: Internal Medicine

## 2022-12-18 DIAGNOSIS — F329 Major depressive disorder, single episode, unspecified: Secondary | ICD-10-CM

## 2023-01-06 ENCOUNTER — Encounter (HOSPITAL_COMMUNITY): Payer: Self-pay

## 2023-01-06 ENCOUNTER — Emergency Department (HOSPITAL_COMMUNITY)
Admission: EM | Admit: 2023-01-06 | Discharge: 2023-01-06 | Disposition: A | Discharge status: Home / Self Care | Payer: Medicaid Other | Attending: Emergency Medicine | Admitting: Emergency Medicine

## 2023-01-06 ENCOUNTER — Other Ambulatory Visit: Payer: Self-pay

## 2023-01-06 DIAGNOSIS — T50901A Poisoning by unspecified drugs, medicaments and biological substances, accidental (unintentional), initial encounter: Secondary | ICD-10-CM | POA: Insufficient documentation

## 2023-01-06 DIAGNOSIS — F10929 Alcohol use, unspecified with intoxication, unspecified: Secondary | ICD-10-CM | POA: Insufficient documentation

## 2023-01-06 DIAGNOSIS — J45909 Unspecified asthma, uncomplicated: Secondary | ICD-10-CM | POA: Insufficient documentation

## 2023-01-06 DIAGNOSIS — F17210 Nicotine dependence, cigarettes, uncomplicated: Secondary | ICD-10-CM | POA: Insufficient documentation

## 2023-01-06 LAB — RAPID URINE DRUG SCREEN, HOSP PERFORMED
Amphetamines: POSITIVE — AB
Barbiturates: NOT DETECTED
Benzodiazepines: NOT DETECTED
Cocaine: NOT DETECTED
Opiates: NOT DETECTED
Tetrahydrocannabinol: POSITIVE — AB

## 2023-01-06 LAB — ETHANOL: Alcohol, Ethyl (B): 76 mg/dL — ABNORMAL HIGH (ref <10)

## 2023-01-06 MED ORDER — SODIUM CHLORIDE 0.9 % IV BOLUS
1000.0000 mL | Freq: Once | INTRAVENOUS | Status: AC
  Administered 2023-01-06: 1000 mL via INTRAVENOUS

## 2023-01-06 MED ORDER — THIAMINE HCL 100 MG/ML IJ SOLN
100.0000 mg | Freq: Once | INTRAMUSCULAR | Status: AC
  Administered 2023-01-06: 100 mg via INTRAVENOUS
  Filled 2023-01-06: qty 2

## 2023-01-06 MED ORDER — HALOPERIDOL LACTATE 5 MG/ML IJ SOLN
5.0000 mg | Freq: Once | INTRAMUSCULAR | Status: AC | PRN
  Administered 2023-01-06: 5 mg via INTRAVENOUS
  Filled 2023-01-06: qty 1

## 2023-01-06 MED ORDER — NALOXONE HCL 0.4 MG/ML IJ SOLN
0.4000 mg | INTRAMUSCULAR | Status: DC | PRN
  Administered 2023-01-06: 0.4 mg via INTRAVENOUS
  Filled 2023-01-06: qty 1

## 2023-01-06 NOTE — ED Provider Notes (Signed)
[facility] DEPT Provider Note: Georgena Spurling, MD, FACEP  CSN: [id number] MRN: [id number] ARRIVAL: 01/06/23 at [place]: [place]  Drug Overdose  Level 5 caveat: Intoxicated HISTORY OF PRESENT ILLNESS  01/06/23 1:52 AM Mitchell Nelson is a 28 y.o. male with a history of polysubstance abuse who reportedly just got out of rehab.  He [place] just prior to arrival when he was found to be minimally responsive.  He did not stop breathing.  EMS did not give Narcan.  The patient admits to drinking alcohol but EMS suspects he also was using opioids as he had pinpoint pupils on scene.   Past Medical History:  Diagnosis Date   ADD (attention deficit disorder)    Asthma    History of substance use disorder 10/23/2022   Substance induced mood disorder ([facility]) 10/23/2022   Although he reports sobriety and extensive prior rehab prior to 10/23/22 depression screen:    10/23/2022   10:14 AM 09/02/2020    9:34 AM 06/15/2020    2:46 PM PHQ9 SCORE ONLY PHQ-9 Total Score 23 0 0   He reports that substances caused the depression.    History reviewed. No pertinent surgical history.  Family History  Problem Relation Age of Onset   Diabetes Maternal Grandmother     Social History   Tobacco Use   Smoking status: Every Day    Packs/day: 0.25    Years: 6.00    Total pack years: 1.50    Types: Cigarettes    Last attempt to quit: 08/31/2018    Years since quitting: 4.3   Smokeless tobacco: Current    Types: Chew  Vaping Use   Vaping Use: Never used  Substance Use Topics   Alcohol use: No    Alcohol/week: 0.0 standard drinks of alcohol   Drug use: Yes    Types: IV    Prior to Admission medications   Medication Sig Start Date End Date Taking? Authorizing Provider  lisdexamfetamine (VYVANSE) 20 MG capsule Take 1 capsule (20 mg total) by mouth daily. 12/08/22  Yes Loralee Pacas, MD  OXcarbazepine (TRILEPTAL) 150 MG tablet Take 1 tablet (150 mg total)  by mouth 2 (two) times daily. 11/06/22  Yes Loralee Pacas, MD  sertraline (ZOLOFT) 100 MG tablet TAKE 1 AND 1/2 TABLETS BY MOUTH DAILY FOR 2 WEEKS, THEN 2 TABLETS BY MOUTH DAILY Patient taking differently: Take 200 mg by mouth daily. 12/18/22  Yes Loralee Pacas, MD  traZODone (DESYREL) 100 MG tablet Take 1 tablet (100 mg total) by mouth at bedtime. 11/06/22  Yes Loralee Pacas, MD    Allergies Patient has no known allergies.   REVIEW OF SYSTEMS  Negative except as noted here or in the History of Present Illness.   PHYSICAL EXAMINATION  Initial Vital Signs Blood pressure 138/89, pulse (!) 119, temperature 98.8 F (37.1 C), temperature source Oral, resp. rate 11, SpO2 92 %.  Examination General: Well-developed, well-nourished male in no acute distress; appearance consistent with age of record HENT: normocephalic; atraumatic Eyes: pupils equal, round and reactive to light; extraocular muscles grossly intact Neck: supple Heart: regular rate and rhythm; tachycardia Lungs: clear to auscultation bilaterally Abdomen: soft; nondistended; nontender; bowel sounds present Extremities: No deformity; full range of motion Neurologic: Somnolent but arousable; noted to move all extremities Skin: Warm and dry Psychiatric: Intoxicated   RESULTS  Summary of this visit's results, reviewed and interpreted by myself:   EKG Interpretation  Date/Time:  Sunday January 06 2023 01:30:50 EST Ventricular Rate:  85 PR Interval:  120 QRS Duration: 105 QT Interval:  334 QTC Calculation: 398 R Axis:   47 Text Interpretation: Sinus arrhythmia Probable left ventricular hypertrophy No significant change was found Confirmed by Izzah Pasqua, Jonny Ruiz ([id number]) on 01/06/2023 1:58:13 AM       Laboratory Studies: Results for orders placed or performed during the hospital encounter of 01/06/23 (from the past 24 hour(s))  Ethanol     Status: Abnormal   Collection Time: 01/06/23  1:30 AM  Result Value Ref Range    Alcohol, Ethyl (B) 76 (H) <10 mg/dL  Rapid urine drug screen (hospital performed)     Status: Abnormal   Collection Time: 01/06/23  1:30 AM  Result Value Ref Range   Opiates NONE DETECTED NONE DETECTED   Cocaine NONE DETECTED NONE DETECTED   Benzodiazepines NONE DETECTED NONE DETECTED   Amphetamines POSITIVE (A) NONE DETECTED   Tetrahydrocannabinol POSITIVE (A) NONE DETECTED   Barbiturates NONE DETECTED NONE DETECTED   Imaging Studies: No results found.  ED COURSE and MDM  Nursing notes, initial and subsequent vitals signs, including pulse oximetry, reviewed and interpreted by myself.  Vitals:   01/06/23 0130 01/06/23 0138 01/06/23 0215 01/06/23 0326  BP: 138/89  132/61 (!) 142/103  Pulse: (!) 119  (!) 105 (!) 105  Resp: 11  11 18   Temp:  98.8 F (37.1 C)    TempSrc:  Oral    SpO2: 92%  97% 95%   Medications  naloxone (NARCAN) injection 0.4 mg (0.4 mg Intravenous Given 01/06/23 0200)  sodium chloride 0.9 % bolus 1,000 mL (1,000 mLs Intravenous New Bag/Given 01/06/23 0204)  thiamine (VITAMIN B1) injection 100 mg (100 mg Intravenous Given 01/06/23 0200)  haloperidol lactate (HALDOL) injection 5 mg (5 mg Intravenous Given 01/06/23 0200)   4:27 AM The patient is awake and alert.  His vital signs have been within normal limits except for mild tachycardia.  He was positive for alcohol but not an excessive amount.  He was also positive for amphetamines and THC and I suspect he was also using fentanyl.  I do not believe our drug screen is sensitive to fentanyl.    PROCEDURES  Procedures   ED DIAGNOSES     ICD-10-CM   1. Accidental overdose, initial encounter  T50.901A     2. Alcoholic intoxication with complication (HCC)  F10.929          Kippy Melena, 03/07/23, MD 01/06/23 [phone]

## 2023-01-06 NOTE — ED Triage Notes (Signed)
Pt. BIB [facility] for suspected opiate overdose. No narcan given by EMS. Pt. Admits to drinking alcohol.

## 2023-01-09 ENCOUNTER — Encounter: Payer: Self-pay | Admitting: Internal Medicine

## 2023-01-09 ENCOUNTER — Ambulatory Visit (INDEPENDENT_AMBULATORY_CARE_PROVIDER_SITE_OTHER): Payer: Self-pay | Admitting: Internal Medicine

## 2023-01-09 VITALS — BP 113/76 | HR 65 | Temp 98.1°F | Ht 69.0 in | Wt 211.6 lb

## 2023-01-09 DIAGNOSIS — F1190 Opioid use, unspecified, uncomplicated: Secondary | ICD-10-CM

## 2023-01-09 NOTE — Progress Notes (Addendum)
[name]: [phone]   Routine Medical Office Visit  Patient:  Mitchell Nelson      Age: 28 y.o.       Sex:  male  Date:   01/09/2023  PCP:    Loralee Pacas, MD   [facility] Provider: Loralee Pacas, MD   Problem Focused Charting:   Medical Decision Making per Assessment/Plan   Mitchell Nelson was seen today for follow-up.  Opioid use disorder Overview: Repeatedly  declined  Suboxone or Vivitrol for treatment of his opioid use disorder  He also follows with a sponsor which I's encouraged him to keep doing as well as continue to make use of his outstanding family support He has outstanding family support but severe history of high risk behavior     Assessment & Plan: Extensive counseling He endorsed recent alcohol binge patient denies alcohol use disorder  - advised this is very dangerous and can spiral  Did 30 min counseling and personalized/indivdualized following reminders and goals and printed for AVS: The main goal from today is to get engaged: #1 right a schedule for a novel and bring it to the next appointment, #2 try to find a new home group for NA or AA, #3 find an exercise activity that you would enjoy such as hiking or pickleball, #4 try to make a new friend that you know will never trigger you- step outside your comfort zone.   The secondary goal from today is to restart journaling particularly with regard to getting in touch with your emotions and finding out about what really drives you deep down what you really think will make you happy. "The self-awareness journey"  2 talk to [facility] or another program that has specialized in treatment and see what kind of deals they offer and also I encourage you strongly to find an employer who will give you insurance.  Very important to work hard on getting Medicaid.    Look into becoming a cfa Regulatory affairs officer) and see if it interests you.   He declined vivitrol  or antabuse.     Physician Time-Spent:  30 minutes of total time  was spent on the date of this encounter performing the following actions: , obtaining history, performing a medically necessary exam, extensive counseling on the treatment plan and lethality of alcohol use in setting of opioid use disorder early recovery, developing individualized goals of therapy, encouraging medication treatment(s) which were ultimately declined, and documenting in our EHR.  The extended time spent was medically necessary because opioid use disorder with binge alcohol relapse can be life threatening and he is avoidant of medical treatment options so extra effort to explain the potential benefits was made.      Subjective - Clinical Presentation:   Mitchell Nelson is a 28 y.o. male  Patient Active Problem List   Diagnosis Date Noted   High risk medication use 12/06/2022   Opioid use disorder 12/06/2022   Obesity due to energy imbalance 10/23/2022   History of substance use disorder 10/23/2022   Substance induced mood disorder ([place]) 10/23/2022   Major depressive disorder with current active episode 10/23/2022   h/o IVDU (intravenous drug user) 10/23/2022   Other insomnia 10/23/2022   Polycythemia 06/01/2019   Cough variant asthma 12/03/2018   ADD (attention deficit disorder) 03/28/2015   Past Medical History:  Diagnosis Date   ADD (attention deficit disorder)    Asthma    History of substance use disorder 10/23/2022  Substance induced mood disorder (HCC) 10/23/2022   Although he reports sobriety and extensive prior rehab prior to 10/23/22 depression screen:    10/23/2022   10:14 AM 09/02/2020    9:34 AM 06/15/2020    2:46 PM PHQ9 SCORE ONLY PHQ-9 Total Score 23 0 0   He reports that substances caused the depression.    Outpatient Medications Prior to Visit  Medication Sig   lisdexamfetamine (VYVANSE) 20 MG capsule Take 1 capsule (20 mg total) by mouth daily.   OXcarbazepine (TRILEPTAL) 150  MG tablet Take 1 tablet (150 mg total) by mouth 2 (two) times daily.   sertraline (ZOLOFT) 100 MG tablet TAKE 1 AND 1/2 TABLETS BY MOUTH DAILY FOR 2 WEEKS, THEN 2 TABLETS BY MOUTH DAILY (Patient taking differently: Take 200 mg by mouth daily.)   traZODone (DESYREL) 100 MG tablet Take 1 tablet (100 mg total) by mouth at bedtime.   Facility-Administered Medications Prior to Visit  Medication Dose Route Frequency Provider   OXcarbazepine (TRILEPTAL) tablet 150 mg  150 mg Oral BID Lula Olszewski, MD    Chief Complaint  Patient presents with   Follow-up    Pt has no questions or concerns     HPI  Today he reports continued sobriety and doing well but he did have a binge alcohol relapse a couple days ago and had bad with migraine afterward.  He is not following with 12-step but does have sponsor- he does not have any hobbies that he is interested in doing.  He continues to have great social support and family support to continuing recovery.        Objective:  Physical Exam  BP 113/76 (BP Location: Left Arm, Patient Position: Sitting)   Pulse 65   Temp 98.1 F (36.7 C) (Temporal)   Ht 5\' 9"  (1.753 m)   Wt 211 lb 9.6 oz (96 kg)   SpO2 97%   BMI 31.25 kg/m  Obese  by BMI criteria but truncal adiposity (waist circumference or caliper) should be used instead. Wt Readings from Last 10 Encounters:  01/09/23 211 lb 9.6 oz (96 kg)  12/06/22 218 lb 12.8 oz (99.2 kg)  11/06/22 227 lb (103 kg)  10/23/22 227 lb (103 kg)  06/15/20 195 lb 9.6 oz (88.7 kg)  04/27/20 194 lb 3.2 oz (88.1 kg)  06/20/19 200 lb (90.7 kg)  06/01/19 200 lb (90.7 kg)  05/02/19 200 lb (90.7 kg)  02/15/19 195 lb (88.5 kg)   Vital signs reviewed.  Nursing notes reviewed. Weight trend reviewed. General Appearance:  Well developed, well nourished male in no acute distress.   Normal work of breathing at rest Musculoskeletal: All extremities are intact.  Neurological:  Awake, alert,  No obvious focal neurological deficits  or cognitive impairments Psychiatric:  Appropriate mood, pleasant demeanor Problem-specific findings:  [place], sober, alert, trustworthy appearing   Results Reviewed: No results found for any visits on 01/09/23.  Recent Results (from the past 2160 hour(s))  Comp Met (CMET)     Status: None   Collection Time: 10/23/22 11:27 AM  Result Value Ref Range   Sodium 140 135 - 145 mEq/L   Potassium 4.0 3.5 - 5.1 mEq/L   Chloride 103 96 - 112 mEq/L   CO2 29 19 - 32 mEq/L   Glucose, Bld 98 70 - 99 mg/dL   BUN 14 6 - 23 mg/dL   Creatinine, Ser 10/25/22 0.40 - 1.50 mg/dL   Total Bilirubin 0.5 0.2 - 1.2 mg/dL  Alkaline Phosphatase 113 39 - 117 U/L   AST 22 0 - 37 U/L   ALT 32 0 - 53 U/L   Total Protein 7.6 6.0 - 8.3 g/dL   Albumin 4.7 3.5 - 5.2 g/dL   GFR 119.94 >60.00 mL/min    Comment: Calculated using the CKD-EPI Creatinine Equation (2021)   Calcium 9.8 8.4 - 10.5 mg/dL  CBC w/Diff     Status: None   Collection Time: 10/23/22 11:27 AM  Result Value Ref Range   WBC 6.1 4.0 - 10.5 K/uL   RBC 5.20 4.22 - 5.81 Mil/uL   Hemoglobin 15.9 13.0 - 17.0 g/dL   HCT 47.3 39.0 - 52.0 %   MCV 91.0 78.0 - 100.0 fl   MCHC 33.5 30.0 - 36.0 g/dL   RDW 13.3 11.5 - 15.5 %   Platelets 240.0 150.0 - 400.0 K/uL   Neutrophils Relative % 44.5 43.0 - 77.0 %   Lymphocytes Relative 43.2 12.0 - 46.0 %   Monocytes Relative 7.9 3.0 - 12.0 %   Eosinophils Relative 3.7 0.0 - 5.0 %   Basophils Relative 0.7 0.0 - 3.0 %   Neutro Abs 2.7 1.4 - 7.7 K/uL   Lymphs Abs 2.6 0.7 - 4.0 K/uL   Monocytes Absolute 0.5 0.1 - 1.0 K/uL   Eosinophils Absolute 0.2 0.0 - 0.7 K/uL   Basophils Absolute 0.0 0.0 - 0.1 K/uL  Hepatitis C Antibody     Status: None   Collection Time: 10/23/22 11:27 AM  Result Value Ref Range   Hepatitis C Ab NON-REACTIVE NON-REACTIVE    Comment: . HCV antibody was non-reactive. There is no laboratory  evidence of HCV infection. . In most cases, no further action is required. However, if recent HCV  exposure is suspected, a test for HCV RNA (test code [id number]) is suggested. . For additional information please refer to http://education.questdiagnostics.com/faq/FAQ22v1 (This link is being provided for informational/ educational purposes only.) .   HIV antibody (with reflex)     Status: None   Collection Time: 10/23/22 11:27 AM  Result Value Ref Range   HIV 1&2 Ab, 4th Generation NON-REACTIVE NON-REACTIVE    Comment: HIV-1 antigen and HIV-1/HIV-2 antibodies were not detected. There is no laboratory evidence of HIV infection. Marland Kitchen PLEASE NOTE: This information has been disclosed to you from records whose confidentiality may be protected by state law.  If your state requires such protection, then the state law prohibits you from making any further disclosure of the information without the specific written consent of the person to whom it pertains, or as otherwise permitted by law. A general authorization for the release of medical or other information is NOT sufficient for this purpose. . For additional information please refer to http://education.questdiagnostics.com/faq/FAQ106 (This link is being provided for informational/ educational purposes only.) . Marland Kitchen The performance of this assay has not been clinically validated in patients less than 54 years old. Marland Kitchen   DRUG MONITOR, PANEL 5, SCREEN, URINE     Status: Abnormal   Collection Time: 12/06/22  1:10 PM  Result Value Ref Range   Amphetamines NEGATIVE <500 ng/mL    Comment: See Note A   Barbiturates NEGATIVE <300 ng/mL    Comment: See Note A   Benzodiazepines NEGATIVE <100 ng/mL    Comment: See Note A   Cocaine Metabolite NEGATIVE <150 ng/mL    Comment: See Note A   Marijuana Metabolite POSITIVE (A) <20 ng/mL    Comment: See Note A   Methadone Metabolite  NEGATIVE <100 ng/mL    Comment: See Note A   Opiates NEGATIVE <100 ng/mL    Comment: See Note A   Oxycodone NEGATIVE <100 ng/mL    Comment: See Note A   Creatinine  >300.0 > or = 20.0 mg/dL   pH 6.1 4.5 - 9.0   Oxidant NEGATIVE <200 mcg/mL  DM TEMPLATE     Status: None   Collection Time: 12/06/22  1:10 PM  Result Value Ref Range   Notes and Comments      Comment: This drug testing is for medical treatment only. Analysis was performed as non-forensic testing and these results should be used only by healthcare providers to render diagnosis or treatment, or to monitor progress of medical conditions. . Note A: The results are presumptive; based only on screening methods, and they have not been confirmed by a definitive method. . . Healthcare Providers needing Interpretation assistance,  please contact us at [phone] ([phone])  M-F, 8am to 10pm EST   Ethanol     Status: Abnormal   Collection Time: 01/06/23  1:30 AM  Result Value Ref Range   Alcohol, Ethyl (B) 76 (H) <10 mg/dL    Comment: (NOTE) Lowest detectable limit for serum alcohol is 10 mg/dL.  For medical purposes only. Performed at [facility], [address] [place], [place] [address]   Rapid urine drug screen (hospital performed)     Status: Abnormal   Collection Time: 01/06/23  1:30 AM  Result Value Ref Range   Opiates NONE DETECTED NONE DETECTED   Cocaine NONE DETECTED NONE DETECTED   Benzodiazepines NONE DETECTED NONE DETECTED   Amphetamines POSITIVE (A) NONE DETECTED   Tetrahydrocannabinol POSITIVE (A) NONE DETECTED   Barbiturates NONE DETECTED NONE DETECTED    Comment: (NOTE) DRUG SCREEN FOR MEDICAL PURPOSES ONLY.  IF CONFIRMATION IS NEEDED FOR ANY PURPOSE, NOTIFY LAB WITHIN 5 DAYS.  LOWEST DETECTABLE LIMITS FOR URINE DRUG SCREEN Drug Class                     Cutoff (ng/mL) Amphetamine and metabolites    1000 Barbiturate and metabolites    200 Benzodiazepine                 200 Opiates and metabolites        300 Cocaine and metabolites        300 THC                            50 Performed at [facility],  [address], [place], [place] [address]           Signed: Lula Olszewski, MD 01/09/2023 5:57 PM

## 2023-01-09 NOTE — Addendum Note (Signed)
Addended by: Loralee Pacas on: 01/09/2023 06:02 PM   Modules accepted: Level of Service

## 2023-01-09 NOTE — Assessment & Plan Note (Addendum)
Extensive counseling He endorsed recent alcohol binge patient denies alcohol use disorder  - advised this is very dangerous and can spiral  Did extensive counseling and personalized/indivdualized following reminders and goals and printed for AVS: The main goal from today is to get engaged: #1 right a schedule for a novel and bring it to the next appointment, #2 try to find a new home group for NA or AA, #3 find an exercise activity that you would enjoy such as hiking or pickleball, #4 try to make a new friend that you know will never trigger you- step outside your comfort zone.   The secondary goal from today is to restart journaling particularly with regard to getting in touch with your emotions and finding out about what really drives you deep down what you really think will make you happy. "The self-awareness journey"  2 talk to [facility] or another program that has specialized in treatment and see what kind of deals they offer and also I encourage you strongly to find an employer who will give you insurance.  Very important to work hard on getting Medicaid.    Look into becoming a cfa Regulatory affairs officer) and see if it interests you.   He declined vivitrol or antabuse.

## 2023-01-09 NOTE — Patient Instructions (Addendum)
The main goal from today is to get engaged: #1 right a schedule for a novel and bring it to the next appointment, #2 try to find a new home group for NA or AA, #3 find an exercise activity that you would enjoy such as hiking or pickleball, #4 try to make a new friend that you know will never trigger you- step outside your comfort zone.   The secondary goal from today is to restart journaling particularly with regard to getting in touch with your emotions and finding out about what really drives you deep down what you really think will make you happy. "The self-awareness journey"  2 talk to [facility] or another program that has specialized in treatment and see what kind of deals they offer and also I encourage you strongly to find an employer who will give you insurance.  Very important to work hard on getting Medicaid.    Look into becoming a cfa Regulatory affairs officer) and see if it interests you.

## 2023-01-17 ENCOUNTER — Other Ambulatory Visit: Payer: Self-pay | Admitting: Internal Medicine

## 2023-01-17 DIAGNOSIS — F988 Other specified behavioral and emotional disorders with onset usually occurring in childhood and adolescence: Secondary | ICD-10-CM

## 2023-02-11 ENCOUNTER — Encounter: Payer: Self-pay | Admitting: Internal Medicine

## 2023-02-11 ENCOUNTER — Other Ambulatory Visit: Payer: Self-pay

## 2023-02-11 ENCOUNTER — Ambulatory Visit (INDEPENDENT_AMBULATORY_CARE_PROVIDER_SITE_OTHER): Payer: Self-pay | Admitting: Internal Medicine

## 2023-02-11 VITALS — BP 128/80 | HR 73 | Temp 98.3°F | Ht 69.0 in | Wt 223.4 lb

## 2023-02-11 DIAGNOSIS — Z79899 Other long term (current) drug therapy: Secondary | ICD-10-CM

## 2023-02-11 DIAGNOSIS — F329 Major depressive disorder, single episode, unspecified: Secondary | ICD-10-CM

## 2023-02-11 DIAGNOSIS — Z5971 Insufficient health insurance coverage: Secondary | ICD-10-CM

## 2023-02-11 DIAGNOSIS — Z5989 Other problems related to housing and economic circumstances: Secondary | ICD-10-CM

## 2023-02-11 DIAGNOSIS — F988 Other specified behavioral and emotional disorders with onset usually occurring in childhood and adolescence: Secondary | ICD-10-CM

## 2023-02-11 HISTORY — DX: Insufficient health insurance coverage: Z59.71

## 2023-02-11 MED ORDER — AMPHETAMINE-DEXTROAMPHET ER 20 MG PO CP24: 20.0000 mg | ORAL_CAPSULE | ORAL | 0 refills | Status: DC

## 2023-02-11 MED ORDER — SERTRALINE HCL 100 MG PO TABS: 200.0000 mg | ORAL_TABLET | Freq: Every day | ORAL | 3 refills | Status: DC

## 2023-02-11 NOTE — Assessment & Plan Note (Signed)
Controlled much better with PHQ-9 down to 13 from 23 will continue at a higher dose of Zoloft 200 mg long-term and I explained not to suddenly stop.  Encouraged him to continue with Trileptal 150 x 2 daily and he intends to do so.  Would refer for psychiatry support but he has no insurance yet.  Medicaid should come available on next month

## 2023-02-11 NOTE — Patient Instructions (Signed)
Please call [facility] and plan to have a visit with them once you get insurance or if they are willing to see for free consultation go ahead and make that first visit.  Plan to have a full bladder when you come in next month.  Take the medication as prescribed especially in the last few days so that it can see it in your system and bring any leftover pills if you happen to not take it every day so that I can document that for staying out of trouble.  Please is keep talking with your sponsor regularly and try to do more meetings or at least not fall off on meetings.  Although alcohol use is technically not forbidden it does not interact with the Adderall whenever it puts you in the ER puts me at risk so please be very careful if you are going to do that again

## 2023-02-11 NOTE — Progress Notes (Signed)
[name]: [phone]   Routine Medical Office Visit  Patient:  Mitchell Nelson      Age: 28 y.o.       Sex:  male  Date:   02/11/2023  PCP:    Loralee Pacas, MD   [place] Provider: Loralee Pacas, MD   Problem Focused Charting:   Medical Decision Making per Assessment/Plan   Kaevion was seen today for 1 month follow-up.  Attention deficit disorder, unspecified hyperactivity presence Overview: History of being treated with Adderall suspended for opioid use disorder that is now in remission   Assessment & Plan: Patient uncontrolled today due to he has not been able to get Vyvanse due to pharmacy issues he asks if we can switch to Adderall which I agreed.  I explained he will need to provide drug screens and bring over leftover bottles to next visit due to history of substance use disorder but he has been in remission.  I explained that relapsing will result in me not being able to continue to prescribe this.  He is maintaining a job which I encouraged  Orders: -     Amphetamine-Dextroamphet ER; Take 1 capsule (20 mg total) by mouth every morning.  Dispense: 30 capsule; Refill: 0  Major depressive disorder with current active episode, unspecified depression episode severity, unspecified whether recurrent Assessment & Plan: Controlled much better with PHQ-9 down to 13 from 23 will continue at a higher dose of Zoloft 200 mg long-term and I explained not to suddenly stop.  Encouraged him to continue with Trileptal 150 x 2 daily and he intends to do so.  Would refer for psychiatry support but he has no insurance yet.  Medicaid should come available on next month  Orders: -     Sertraline HCl; Take 2 tablets (200 mg total) by mouth daily.  Dispense: 180 tablet; Refill: 3  Does not have health insurance Overview:  Would refer for psychiatry support but he has no insurance yet.  Medicaid should come available on next month urging for him  to get the Medicaid as soon as possible   High risk medication use Overview: Indication for controlled substance: add Medication and dose: see associated problem and med list # pills per month: 30 Last UDS date: 12/06/22  office and 01/06/23 emergency room visit Controlled substance contract signed (Y/N): Yes, scanned  PDMP last reviewed (include red flags): PDMP reviewed during this encounter.  Diversion Prevention:  warned of risk of discontinuation with legal or substance use issues,  we have signed contract,  plan continued intermittent random drug screens and pill counts  Interim history: Vyvanse was helping his add but he couldn't get it anymore at pharmacy         Subjective - Clinical Presentation:   Mitchell Nelson is a 28 y.o. male  Patient Active Problem List   Diagnosis Date Noted   Does not have health insurance 02/11/2023   High risk medication use 12/06/2022   Opioid use disorder 12/06/2022   Obesity due to energy imbalance 10/23/2022   History of substance use disorder 10/23/2022   Substance induced mood disorder ([place]) 10/23/2022   Major depressive disorder with current active episode 10/23/2022   h/o IVDU (intravenous drug user) 10/23/2022   Other insomnia 10/23/2022   Polycythemia 06/01/2019   Cough variant asthma 12/03/2018   ADD (attention deficit disorder) 03/28/2015   Past Medical History:  Diagnosis Date   ADD (attention deficit disorder)  Asthma    Does not have health insurance 02/11/2023   History of substance use disorder 10/23/2022   Substance induced mood disorder ([place]) 10/23/2022   Although he reports sobriety and extensive prior rehab prior to 10/23/22 depression screen:    10/23/2022   10:14 AM 09/02/2020    9:34 AM 06/15/2020    2:46 PM PHQ9 SCORE ONLY PHQ-9 Total Score 23 0 0   He reports that substances caused the depression.    Outpatient Medications Prior to Visit  Medication Sig   OXcarbazepine (TRILEPTAL) 150 MG tablet Take  1 tablet (150 mg total) by mouth 2 (two) times daily.   traZODone (DESYREL) 100 MG tablet Take 1 tablet (100 mg total) by mouth at bedtime.   [DISCONTINUED] lisdexamfetamine (VYVANSE) 20 MG capsule Take 1 capsule (20 mg total) by mouth daily.   [DISCONTINUED] sertraline (ZOLOFT) 100 MG tablet TAKE 1 AND 1/2 TABLETS BY MOUTH DAILY FOR 2 WEEKS, THEN 2 TABLETS BY MOUTH DAILY (Patient taking differently: Take 200 mg by mouth daily.)   Facility-Administered Medications Prior to Visit  Medication Dose Route Frequency Provider   OXcarbazepine (TRILEPTAL) tablet 150 mg  150 mg Oral BID Loralee Pacas, MD    Chief Complaint  Patient presents with   1 month follow-up    Everything is about the same.    HPI           Objective:  Physical Exam  BP 128/80 (BP Location: Left Arm, Patient Position: Sitting)   Pulse 73   Temp 98.3 F (36.8 C) (Temporal)   Ht 5' 9"$  (1.753 m)   Wt 223 lb 6.4 oz (101.3 kg)   SpO2 98%   BMI 32.99 kg/m  Obese  by BMI criteria but truncal adiposity (waist circumference or caliper) should be used instead. He has some abdomen adiposity. Wt Readings from Last 10 Encounters:  02/11/23 223 lb 6.4 oz (101.3 kg)  01/09/23 211 lb 9.6 oz (96 kg)  12/06/22 218 lb 12.8 oz (99.2 kg)  11/06/22 227 lb (103 kg)  10/23/22 227 lb (103 kg)  06/15/20 195 lb 9.6 oz (88.7 kg)  04/27/20 194 lb 3.2 oz (88.1 kg)  06/20/19 200 lb (90.7 kg)  06/01/19 200 lb (90.7 kg)  05/02/19 200 lb (90.7 kg)   Vital signs reviewed.  Nursing notes reviewed. Weight trend reviewed. General Appearance:  Well developed, well nourished male in no acute distress.   Normal work of breathing at rest Musculoskeletal: All extremities are intact.  Neurological:  Awake, alert,  No obvious focal neurological deficits or cognitive impairments Psychiatric:  Appropriate mood, pleasant demeanor Problem-specific findings:  looks well. Appears sober. Very friendly, nice guy   Results Reviewed: No results found  for any visits on 02/11/23.  Recent Results (from the past 2160 hour(s))  DRUG MONITOR, PANEL 5, SCREEN, URINE     Status: Abnormal   Collection Time: 12/06/22  1:10 PM  Result Value Ref Range   Amphetamines NEGATIVE <500 ng/mL    Comment: See Note A   Barbiturates NEGATIVE <300 ng/mL    Comment: See Note A   Benzodiazepines NEGATIVE <100 ng/mL    Comment: See Note A   Cocaine Metabolite NEGATIVE <150 ng/mL    Comment: See Note A   Marijuana Metabolite POSITIVE (A) <20 ng/mL    Comment: See Note A   Methadone Metabolite NEGATIVE <100 ng/mL    Comment: See Note A   Opiates NEGATIVE <100 ng/mL    Comment: See  Note A   Oxycodone NEGATIVE <100 ng/mL    Comment: See Note A   Creatinine >300.0 > or = 20.0 mg/dL   pH 6.1 4.5 - 9.0   Oxidant NEGATIVE <200 mcg/mL  DM TEMPLATE     Status: None   Collection Time: 12/06/22  1:10 PM  Result Value Ref Range   Notes and Comments      Comment: This drug testing is for medical treatment only. Analysis was performed as non-forensic testing and these results should be used only by healthcare providers to render diagnosis or treatment, or to monitor progress of medical conditions. . Note A: The results are presumptive; based only on screening methods, and they have not been confirmed by a definitive method. . . Healthcare Providers needing Interpretation assistance,  please contact us at [phone] ([phone])  M-F, 8am to 10pm EST   Ethanol     Status: Abnormal   Collection Time: 01/06/23  1:30 AM  Result Value Ref Range   Alcohol, Ethyl (B) 76 (H) <10 mg/dL    Comment: (NOTE) Lowest detectable limit for serum alcohol is 10 mg/dL.  For medical purposes only. Performed at [facility], [place] [address], [place], [place] [address]   Rapid urine drug screen (hospital performed)     Status: Abnormal   Collection Time: 01/06/23  1:30 AM  Result Value Ref Range   Opiates NONE DETECTED NONE DETECTED    Cocaine NONE DETECTED NONE DETECTED   Benzodiazepines NONE DETECTED NONE DETECTED   Amphetamines POSITIVE (A) NONE DETECTED   Tetrahydrocannabinol POSITIVE (A) NONE DETECTED   Barbiturates NONE DETECTED NONE DETECTED    Comment: (NOTE) DRUG SCREEN FOR MEDICAL PURPOSES ONLY.  IF CONFIRMATION IS NEEDED FOR ANY PURPOSE, NOTIFY LAB WITHIN 5 DAYS.  LOWEST DETECTABLE LIMITS FOR URINE DRUG SCREEN Drug Class                     Cutoff (ng/mL) Amphetamine and metabolites    1000 Barbiturate and metabolites    200 Benzodiazepine                 200 Opiates and metabolites        300 Cocaine and metabolites        300 THC                            50 Performed at [facility], [place] [address], [place], [place] [address]           Signed: Loralee Pacas, MD 02/11/2023 2:24 PM

## 2023-02-11 NOTE — Assessment & Plan Note (Signed)
Patient uncontrolled today due to he has not been able to get Vyvanse due to pharmacy issues he asks if we can switch to Adderall which I agreed.  I explained he will need to provide drug screens and bring over leftover bottles to next visit due to history of substance use disorder but he has been in remission.  I explained that relapsing will result in me not being able to continue to prescribe this.  He is maintaining a job which I encouraged

## 2023-02-11 NOTE — Addendum Note (Signed)
Addended by: Loralee Pacas on: 02/11/2023 03:29 PM   Modules accepted: Orders

## 2023-02-12 ENCOUNTER — Encounter: Payer: Self-pay | Admitting: Internal Medicine

## 2023-02-12 NOTE — Telephone Encounter (Signed)
Spoke with the pharmacy and was informed that he doesn't currently have insurance and has been paying out of pocket. I advised patient to call them and let them know that he is planning to pay out of pocket.

## 2023-03-01 DIAGNOSIS — Z419 Encounter for procedure for purposes other than remedying health state, unspecified: Secondary | ICD-10-CM | POA: Diagnosis not present

## 2023-03-11 ENCOUNTER — Encounter: Payer: Self-pay | Admitting: Internal Medicine

## 2023-03-11 ENCOUNTER — Ambulatory Visit (INDEPENDENT_AMBULATORY_CARE_PROVIDER_SITE_OTHER): Payer: Medicaid Other | Admitting: Internal Medicine

## 2023-03-11 VITALS — BP 116/88 | HR 74 | Temp 98.3°F | Ht 69.0 in | Wt 222.6 lb

## 2023-03-11 DIAGNOSIS — F329 Major depressive disorder, single episode, unspecified: Secondary | ICD-10-CM | POA: Diagnosis not present

## 2023-03-11 DIAGNOSIS — Z79899 Other long term (current) drug therapy: Secondary | ICD-10-CM

## 2023-03-11 DIAGNOSIS — E669 Obesity, unspecified: Secondary | ICD-10-CM | POA: Diagnosis not present

## 2023-03-11 DIAGNOSIS — F1190 Opioid use, unspecified, uncomplicated: Secondary | ICD-10-CM

## 2023-03-11 DIAGNOSIS — D751 Secondary polycythemia: Secondary | ICD-10-CM

## 2023-03-11 DIAGNOSIS — Z87898 Personal history of other specified conditions: Secondary | ICD-10-CM | POA: Diagnosis not present

## 2023-03-11 DIAGNOSIS — F988 Other specified behavioral and emotional disorders with onset usually occurring in childhood and adolescence: Secondary | ICD-10-CM | POA: Diagnosis not present

## 2023-03-11 MED ORDER — METFORMIN HCL 500 MG PO TABS: 500.0000 mg | ORAL_TABLET | Freq: Two times a day (BID) | ORAL | 3 refills | Status: DC

## 2023-03-11 MED ORDER — AMPHETAMINE-DEXTROAMPHET ER 20 MG PO CP24: 20.0000 mg | ORAL_CAPSULE | ORAL | 0 refills | Status: DC

## 2023-03-11 NOTE — Assessment & Plan Note (Addendum)
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

## 2023-03-11 NOTE — Assessment & Plan Note (Signed)
Much improved  Declined medication or counseling or psychiatry rfl Encouraged patient to oxcarbazepine and Zoloft Doesn't need refills Encouraged patient to stay active , get sunlight, make sure to keep good sleep hygiene.

## 2023-03-11 NOTE — Assessment & Plan Note (Signed)
Well controlled Taking Adderall 20 daily- reports consistent with that no holidays.

## 2023-03-11 NOTE — Assessment & Plan Note (Signed)
Encouraged patient to consider weight loss medications and do diet and exercise and get good sleep.

## 2023-03-11 NOTE — Progress Notes (Signed)
[name]: [phone]   Routine Medical Office Visit  Patient:  Mitchell Nelson      Age: 28 y.o.       Sex:  male  Date:   03/11/2023  PCP:    Loralee Pacas, MD   [place] Provider: Loralee Pacas, MD   Assessment and Plan:   Jonus was seen today for 1 month follow-up.  Attention deficit disorder, unspecified hyperactivity presence Overview: History of being treated with Adderall suspended for opioid use disorder that is now in remission   Assessment & Plan: Well controlled Taking Adderall 20 daily- reports consistent with that no holidays.   Orders: -     Amphetamine-Dextroamphet ER; Take 1 capsule (20 mg total) by mouth every morning.  Dispense: 30 capsule; Refill: 0  Major depressive disorder with current active episode, unspecified depression episode severity, unspecified whether recurrent Overview:    03/11/2023   10:28 AM 02/11/2023    1:28 PM 10/23/2022   10:14 AM  Depression screen PHQ 2/9  Decreased Interest '1 1 3  '$ Down, Depressed, Hopeless 0 1 3  PHQ - 2 Score '1 2 6  '$ Altered sleeping '1 1 3  '$ Tired, decreased energy '1 2 3  '$ Change in appetite '1 2 3  '$ Feeling bad or failure about yourself  0 1 1  Trouble concentrating '1 3 3  '$ Moving slowly or fidgety/restless '1 3 3  '$ Suicidal thoughts 0 0 1  PHQ-9 Score '6 14 23  '$ Difficult doing work/chores Not difficult at all Somewhat difficult Very difficult     Assessment & Plan: Much improved  Declined medication or counseling or psychiatry rfl Encouraged patient to oxcarbazepine and Zoloft Doesn't need refills Encouraged patient to stay active , get sunlight, make sure to keep good sleep hygiene.   High risk medication use Overview: Indication for controlled substance: Attention Deficit Hyperactivity Disorder (ADHD)  Medication and dose: see medication list- stimulant # pills per month: 30 Last UDS date: 12/06/22  office and 01/06/23 emergency room  visit Controlled substance contract signed (Y/N): Yes, scanned  PDMP last reviewed (include red flags): PDMP reviewed during this encounter.  Diversion Prevention:  warned of risk of discontinuation with legal or substance use issues,  we have signed contract,  plan continued intermittent random drug screens and pill counts.  He will return to clinic more frequently than typical patients due to history of substance use disorder and is expected to stay in remission as a condition of continued medication management for Attention Deficit Hyperactivity Disorder (ADHD).  Interim history: Vyvanse was helping his add but he couldn't get it anymore at pharmacy and it cost 120$ Adderall working great.  He forgot pill bottle but will start bringing.    Assessment & Plan: Patient reports maintaining employment and [employer] thanks to help of this medication.   Polycythemia Overview: CBC     Obesity due to energy imbalance Overview: BMI Readings from Last 5 Encounters:  03/11/23 32.87 kg/m  02/11/23 32.99 kg/m  01/09/23 31.25 kg/m  12/06/22 32.31 kg/m  11/06/22 33.52 kg/m   Wt Readings from Last 3 Encounters:  03/11/23 222 lb 9.6 oz (101 kg)  02/11/23 223 lb 6.4 oz (101.3 kg)  01/09/23 211 lb 9.6 oz (96 kg)     Assessment & Plan: Encouraged patient to consider weight loss medications and do diet and exercise and get good sleep.   Orders: -     metFORMIN HCl; Take 1 tablet (500 mg total)  by mouth 2 (two) times daily with a meal.  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 I tried hard to get him to agree to vivitrol to minimize risk of death because he is very vulnerable to overdose if he relapses now after being sober for a while.         Clinical Presentation:   The patient is a 28 y.o. male: Active Ambulatory Problems    Diagnosis Date Noted   ADD (attention deficit disorder) 03/28/2015   Cough variant asthma 12/03/2018   Obesity due to energy imbalance 10/23/2022   History of substance use disorder 10/23/2022   Substance induced mood disorder ([place]) 10/23/2022   Major depressive disorder with current active episode 10/23/2022   h/o IVDU (intravenous drug user) 10/23/2022   High risk medication use 12/06/2022   Opioid use disorder 12/06/2022   Resolved Ambulatory Problems    Diagnosis Date Noted   Asthma exacerbation 06/01/2019   Polycythemia 06/01/2019   Sinus tachycardia 06/01/2019   Other insomnia 10/23/2022   Does not have health insurance 02/11/2023   Past Medical History:  Diagnosis Date   Asthma     Outpatient Medications Prior to Visit  Medication Sig   OXcarbazepine (TRILEPTAL) 150 MG tablet Take 1 tablet (150 mg total) by mouth 2 (two) times daily.   sertraline (ZOLOFT) 100 MG tablet Take 2 tablets (200 mg total) by mouth daily.   traZODone (DESYREL) 100 MG tablet Take 1 tablet (100 mg total) by mouth at bedtime.   [DISCONTINUED] amphetamine-dextroamphetamine (ADDERALL XR) 20 MG 24 hr capsule Take 1 capsule (20 mg total) by mouth every morning.   Facility-Administered Medications Prior to Visit  Medication  Dose Route Frequency Provider   OXcarbazepine (TRILEPTAL) tablet 150 mg  150 mg Oral BID Loralee Pacas, MD     Chief Complaint  Patient presents with   1 month follow-up    HPI  Depression improving, patient reports compliant with oxcarbazepine and zoloft Feels he still has withdrawal from stimulants even though sober for months Patient reports compliant with Adderall 20 and working well for him, didn't bring bottle but only has 1 pill left        Clinical Data Analysis:  Physical Exam  BP 116/88 (BP Location: Left Arm, Patient Position: Sitting)   Pulse 74   Temp 98.3 F (36.8 C) (Temporal)   Ht '5\' 9"'$  (1.753 m)   Wt 222 lb 9.6 oz (101 kg)   SpO2 97%   BMI 32.87 kg/m  Wt Readings from Last 10 Encounters:  03/11/23 222 lb 9.6 oz (101 kg)  02/11/23 223 lb 6.4 oz (101.3 kg)  01/09/23 211 lb  9.6 oz (96 kg)  12/06/22 218 lb 12.8 oz (99.2 kg)  11/06/22 227 lb (103 kg)  10/23/22 227 lb (103 kg)  06/15/20 195 lb 9.6 oz (88.7 kg)  04/27/20 194 lb 3.2 oz (88.1 kg)  06/20/19 200 lb (90.7 kg)  06/01/19 200 lb (90.7 kg)   Vital signs reviewed.  Nursing notes reviewed. Weight trend reviewed. Abnormalities noted: Body mass index is 32.87 kg/m.  BMI is an unreliable indicator of healthy body composition due to its inability to reflect lean muscle mass.  General Appearance:  Well developed, well nourished male in no acute distress.   Pulmonary:  Normal work of breathing at rest, no respiratory distress apparent. SpO2: 97 %  Musculoskeletal: All extremities are intact.  Neurological:  Awake, alert. No obvious focal neurological deficits or cognitive impairments.  Sensorium seems unclouded. Psychiatric:  Appropriate mood, pleasant demeanor Problem-specific findings:  well dressed/groomed, sober/alert/articulate appearing,    Results Reviewed: (reviewed labs/imaging may be also be found in the assessment / plan section):     No results found for any visits on 03/11/23.  Recent  Results (from the past 2160 hour(s))  Ethanol     Status: Abnormal   Collection Time: 01/06/23  1:30 AM  Result Value Ref Range   Alcohol, Ethyl (B) 76 (H) <10 mg/dL    Comment: (NOTE) Lowest detectable limit for serum alcohol is 10 mg/dL.  For medical purposes only. Performed at [facility], [place] [address] [place], [place] [address]   Rapid urine drug screen (hospital performed)     Status: Abnormal   Collection Time: 01/06/23  1:30 AM  Result Value Ref Range   Opiates NONE DETECTED NONE DETECTED   Cocaine NONE DETECTED NONE DETECTED   Benzodiazepines NONE DETECTED NONE DETECTED   Amphetamines POSITIVE (A) NONE DETECTED   Tetrahydrocannabinol POSITIVE (A) NONE DETECTED   Barbiturates NONE DETECTED NONE DETECTED    Comment: (NOTE) DRUG SCREEN FOR MEDICAL PURPOSES ONLY.  IF CONFIRMATION IS NEEDED FOR ANY PURPOSE, NOTIFY LAB WITHIN 5 DAYS.  LOWEST DETECTABLE LIMITS FOR URINE DRUG SCREEN Drug Class                     Cutoff (ng/mL) Amphetamine and metabolites    1000 Barbiturate and metabolites    200 Benzodiazepine                 200 Opiates and metabolites        300 Cocaine and metabolites        300 THC                            50 Performed at [facility], [place] [address], [place], [place] [address]     No image results found.        Signed: Loralee Pacas, MD 03/11/2023 10:53 AM

## 2023-03-11 NOTE — Assessment & Plan Note (Signed)
Patient reports maintaining employment and [employer] thanks to help of this medication.

## 2023-04-01 DIAGNOSIS — Z419 Encounter for procedure for purposes other than remedying health state, unspecified: Secondary | ICD-10-CM | POA: Diagnosis not present

## 2023-04-11 ENCOUNTER — Ambulatory Visit: Payer: Medicaid Other | Admitting: Internal Medicine

## 2023-04-12 ENCOUNTER — Ambulatory Visit: Payer: Medicaid Other | Admitting: Internal Medicine

## 2023-04-26 ENCOUNTER — Ambulatory Visit: Payer: Medicaid Other | Admitting: Internal Medicine

## 2023-05-01 DIAGNOSIS — Z419 Encounter for procedure for purposes other than remedying health state, unspecified: Secondary | ICD-10-CM | POA: Diagnosis not present

## 2023-05-07 ENCOUNTER — Ambulatory Visit (INDEPENDENT_AMBULATORY_CARE_PROVIDER_SITE_OTHER): Payer: Medicaid Other | Admitting: Internal Medicine

## 2023-05-07 ENCOUNTER — Encounter: Payer: Self-pay | Admitting: Internal Medicine

## 2023-05-07 VITALS — BP 124/82 | HR 76 | Temp 97.7°F | Ht 69.0 in | Wt 220.0 lb

## 2023-05-07 DIAGNOSIS — Z79899 Other long term (current) drug therapy: Secondary | ICD-10-CM | POA: Diagnosis not present

## 2023-05-07 DIAGNOSIS — F1190 Opioid use, unspecified, uncomplicated: Secondary | ICD-10-CM | POA: Diagnosis not present

## 2023-05-07 DIAGNOSIS — J45991 Cough variant asthma: Secondary | ICD-10-CM | POA: Diagnosis not present

## 2023-05-07 DIAGNOSIS — F988 Other specified behavioral and emotional disorders with onset usually occurring in childhood and adolescence: Secondary | ICD-10-CM | POA: Diagnosis not present

## 2023-05-07 MED ORDER — AMPHETAMINE-DEXTROAMPHETAMINE 20 MG PO TABS: 20.0000 mg | ORAL_TABLET | Freq: Two times a day (BID) | ORAL | 0 refills | Status: DC

## 2023-05-07 MED ORDER — AIRSUPRA 90-80 MCG/ACT IN AERO: 2.0000 | INHALATION_SPRAY | Freq: Four times a day (QID) | RESPIRATORY_TRACT | 1 refills | Status: DC | PRN

## 2023-05-07 NOTE — Progress Notes (Signed)
[facility]: [phone]   Routine Medical Office Visit  Patient:  Mitchell Nelson      Age: 28 y.o.       Sex:  male  Date:   05/07/2023 PCP:    Lula Olszewski, MD   Today's Healthcare Provider: Lula Olszewski, MD       Assessment and Plan:   Attention deficit disorder, unspecified hyperactivity presence Assessment & Plan: Given the patient's history of substance use disorder and the request for a change in ADHD medication, a careful re-evaluation of his current ADHD management plan was warranted. While optimizing ADHD symptom control is a priority, the potential risks associated with changing to an immediate-release stimulant formulation must be carefully weighed. Consideration was given to adjusting the dose of the current medication under close supervision, exploring alternative extended-release stimulant formulations, or considering non-stimulant options.  The decision to grant the medication changes was accompanied by regular monitoring for signs of misuse or diversion, as well as ongoing support for the patient's recovery from substance use disorder, so only 30 day supply was given with expectation that he send pictures of medication(s) on MyChart half way through to demonstrate no diversion.  Orders: -     Amphetamine-Dextroamphetamine; Take 1 tablet (20 mg total) by mouth 2 (two) times daily.  Dispense: 60 tablet; Refill: 0  Opioid use disorder -     ToxASSURE Select 12(MW)  High risk medication use Assessment & Plan: He feels like the extended release does not work well for him and he has to switch to the immediate release and increase it to twice daily.  I agreed to this on the condition that he sent me pictures halfway through the month demonstrating that he is taking it as prescribed and this is mainly for a diversion prevention strategy to satisfy the DEA requirement.  I explained that he should take it only as directed and that failing  to limit his dose to the prescribed amount puts him at risk of heart failure and psychosis that could wreck his life even resulting in death. The dose of this new prescription to just 1 month so we can see how it is going to work out and to ensure that he follows through with adherence to our diversion prevention strategy  Orders: -     ToxASSURE Select 12(MW)  Cough variant asthma -     Airsupra; Inhale 2 puffs into the lungs 4 (four) times daily as needed.  Dispense: 32.1 g; Refill: 1       Treatment plan discussed and reviewed in detail. Explained medication safety and potential side effects. Agreed on patient returning to office if symptoms worsen, persist, or new symptoms develop. Discussed precautions in case of needing to visit the Emergency Department. Answered all patient questions and confirmed understanding and comfort with the plan. Encouraged patient to contact our office if they have any questions or concerns.         Clinical Presentation:   28 y.o. male here today for Medical Management of Chronic Issues (1 month f/u.  No concerns. )  HPI   Updated chart data:  Problem  High Risk Medication Use   Indication for controlled substance: Attention Deficit Hyperactivity Disorder (ADHD)  Medication and dose: see medication list- stimulant # pills per month: 30 Last UDS date: 12/06/22  office and 01/06/23 emergency room visit Controlled substance contract signed (Y/N): Yes, scanned  PDMP last reviewed (include red flags): PDMP reviewed during this encounter.  Diversion Prevention:  warned of risk of discontinuation with legal or substance use issues,  we have signed contract,  plan continued intermittent random drug screens and pill counts.  He will return to clinic more frequently than typical patients due to history of substance use disorder and is expected to stay in remission as a condition of continued medication management for Attention Deficit Hyperactivity Disorder  (ADHD).       Opioid Use Disorder   05/07/2023 Interim history: He reports he has not had any slips since our last visit and he is working with his sponsor and in the program and is not having problems with triggers peoples places things or any other issues at this time meeting every sunday   Prior history:  Repeatedly  declined  Suboxone or Vivitrol for treatment of his opioid use disorder  He also follows with a sponsor which I's encouraged him to keep doing as well as continue to make use of his outstanding family support He has outstanding family support but severe history of high risk behavior He has health insurance- but still doesn't want the medical treatments. I tried hard to get him to agree to vivitrol to minimize risk of death because he is very vulnerable to overdose if he relapses now after being sober for a while.    Tobacco Use Disorder  Wheezing   Formatting of this note might be different from the original. Spacer ordered.   Moderate Persistent Asthma Without Complication   Formatting of this note might be different from the original. Pt reports last asthma attack was 2 days ago. Pt is currently wheezing throughout, but is in NAD and without complaints. He typically uses an inhaler at least 5 days weekly. Encouraged smoking cessation. Start Breo (200/25) 1 puff daily and albuterol 2 puffs q 6 hours prn. Re-examine lungs 7/4 and prn.   Opioid Use Disorder, Severe, Dependence (Hcc)  Cough Variant Asthma   Interim history: Was wheezy today and it is due to allergies he is not that he does not have a rescue inhaler  -Aggravated by Vape exposure 09/2018 / steroid responsive - Spirometry 12/02/2018  FEV1 3.3 (89%)  Ratio 78 s curvature 2 d p completed pred taper  - 12/02/2018  After extensive coaching inhaler device,  effectiveness = 75% > try sample of asmanex x 4 weeks then rtc      Add (Attention Deficit Disorder)   Interim history:  patient reports Adderall XR 20 capsule too  weak, wants tablets and 2 a day, patient reports pill bottle left at home due to empty at time of appointment.  Last visit: : Vyvanse was helping his add but he couldn't get it anymore at pharmacy and it cost 120$ Adderall was working great.  He forgot pill bottle but will start bringing.  History of being treated with Adderall suspended for opioid use disorder that is now in remission Also on chart elsewhere thre was cocaine disorder(s) diagnosed but he consistently reports he doesn't like cocaine and main struggle is with opioid use disorder.      Reviewed chart data: Active Ambulatory Problems    Diagnosis Date Noted   ADD (attention deficit disorder) 03/28/2015   Cough variant asthma 12/03/2018   Obesity due to energy imbalance 10/23/2022   History of substance use disorder 10/23/2022   Substance induced mood disorder (HCC) 10/23/2022   Major depressive disorder with current active episode 10/23/2022   h/o IVDU (intravenous drug user) 10/23/2022   High risk  medication use 12/06/2022   Opioid use disorder 12/06/2022   Wheezing 07/02/2020   Tobacco use disorder 09/09/2020   Opioid use disorder, severe, dependence (HCC) 07/01/2020   Moderate persistent asthma without complication 07/02/2020   Resolved Ambulatory Problems    Diagnosis Date Noted   Asthma exacerbation 06/01/2019   Polycythemia 06/01/2019   Sinus tachycardia 06/01/2019   Other insomnia 10/23/2022   Does not have health insurance 02/11/2023   Past Medical History:  Diagnosis Date   Asthma     Outpatient Medications Prior to Visit  Medication Sig   metFORMIN (GLUCOPHAGE) 500 MG tablet Take 1 tablet (500 mg total) by mouth 2 (two) times daily with a meal.   sertraline (ZOLOFT) 100 MG tablet Take 2 tablets (200 mg total) by mouth daily.   traZODone (DESYREL) 100 MG tablet Take 1 tablet (100 mg total) by mouth at bedtime.   [DISCONTINUED] amphetamine-dextroamphetamine (ADDERALL XR) 20 MG 24 hr capsule Take 1  capsule (20 mg total) by mouth every morning.   [DISCONTINUED] OXcarbazepine (TRILEPTAL) 150 MG tablet Take 1 tablet (150 mg total) by mouth 2 (two) times daily.   [DISCONTINUED] OXcarbazepine (TRILEPTAL) tablet 150 mg    No facility-administered medications prior to visit.           Clinical Data Analysis:   Physical Exam  BP 124/82   Pulse 76   Temp 97.7 F (36.5 C) (Temporal)   Ht 5\' 9"  (1.753 m)   Wt 220 lb (99.8 kg)   SpO2 96%   BMI 32.49 kg/m  Wt Readings from Last 10 Encounters:  05/07/23 220 lb (99.8 kg)  03/11/23 222 lb 9.6 oz (101 kg)  02/11/23 223 lb 6.4 oz (101.3 kg)  01/09/23 211 lb 9.6 oz (96 kg)  12/06/22 218 lb 12.8 oz (99.2 kg)  11/06/22 227 lb (103 kg)  10/23/22 227 lb (103 kg)  06/15/20 195 lb 9.6 oz (88.7 kg)  04/27/20 194 lb 3.2 oz (88.1 kg)  06/20/19 200 lb (90.7 kg)   Vital signs reviewed.  Nursing notes reviewed. Weight trend reviewed. Abnormalities and Problem-Specific physical exam findings:  a little wheezy today , sober appearing General Appearance:  No acute distress appreciable.   Well-groomed, healthy-appearing male.  Well proportioned with no abnormal fat distribution.  Good muscle tone. Skin: Clear and well-hydrated. Pulmonary:  Normal work of breathing at rest, no respiratory distress apparent. SpO2: 96 %  Musculoskeletal: All extremities are intact.  Neurological:  Awake, alert, oriented, and engaged.  No obvious focal neurological deficits or cognitive impairments.  Sensorium seems unclouded.   Speech is clear and coherent with logical content. Psychiatric:  Appropriate mood, pleasant and cooperative demeanor, thoughtful and engaged during the exam   Additional Results Reviewed:     No results found for any visits on 05/07/23.  No results found for this or any previous visit (from the past 2160 hour(s)).  No image results found.   No results found.   --------------------------------    Signed: Lula Olszewski, MD 05/07/2023  10:24 AM

## 2023-05-07 NOTE — Patient Instructions (Addendum)
It was a pleasure seeing you today!  Your health and satisfaction are my top priorities. If you believe your experience today was worthy of a 5-star rating, I would be grateful for your feedback! Lula Olszewski, MD   [x]    I always recommend close follow up (within 1-2 weeks) with your Primary Care Provider (PCP) for any acute problems.  If you are not doing well:  Return to the office sooner.  If your condition begins worsening or become severe or you can't get a sooner appointment :  go to the emergency room.  [x]    Please carefully review your clinical instructions and notes on MyChart (they will be finished later)  Attention deficit disorder, unspecified hyperactivity presence Assessment & Plan: Given the patient's history of substance use disorder and the request for a change in ADHD medication, a careful re-evaluation of his current ADHD management plan was warranted. While optimizing ADHD symptom control is a priority, the potential risks associated with changing to an immediate-release stimulant formulation must be carefully weighed. Consideration was given to adjusting the dose of the current medication under close supervision, exploring alternative extended-release stimulant formulations, or considering non-stimulant options.  The decision to grant the medication changes was accompanied by regular monitoring for signs of misuse or diversion, as well as ongoing support for the patient's recovery from substance use disorder, so only 30 day supply was given with expectation that he send pictures of medication(s) on MyChart half way through to demonstrate no diversion.  Orders: -     Amphetamine-Dextroamphetamine; Take 1 tablet (20 mg total) by mouth 2 (two) times daily.  Dispense: 60 tablet; Refill: 0  Opioid use disorder -     ToxASSURE Select 12(MW)  High risk medication use Assessment & Plan: He feels like the extended release does not work well for him and he has to switch to the  immediate release and increase it to twice daily.  I agreed to this on the condition that he sent me pictures halfway through the month demonstrating that he is taking it as prescribed and this is mainly for a diversion prevention strategy to satisfy the DEA requirement.  I explained that he should take it only as directed and that failing to limit his dose to the prescribed amount puts him at risk of heart failure and psychosis that could wreck his life even resulting in death. The dose of this new prescription to just 1 month so we can see how it is going to work out and to ensure that he follows through with adherence to our diversion prevention strategy  Orders: -     ToxASSURE Select 12(MW)  Cough variant asthma -     Airsupra; Inhale 2 puffs into the lungs 4 (four) times daily as needed.  Dispense: 32.1 g; Refill: 1     QUESTIONS & CONCERNS: CLINICAL: please contact us via phone [phone] OR MyChart messaging  LAB & IMAGING:   We will call you if the results are significantly abnormal or you don't use MyChart.  Most normal results will be posted to MyChart immediately and have a clinical review message by Dr. Jon Billings posted within 2-3 business days.   If you have not heard from [place] regarding the results in 2 weeks OR if you need priority reporting, please contact this office. MYCHART:  The fastest way to get your results and easiest way to stay in touch with [place] is by activating your My Chart account. Instructions are located  on the last page of this paperwork.  BILLING: xray and lab orders are billed from separate companies and questions./concerns should be directed to the invoicing company.  For visit charges please discuss with our administrative services. COMPLAINTS:  please let Dr. Jon Billings know or see the [facility] Administrator - Edwena Felty or [facility], by asking at the front desk: we want you to be satisfied with every experience and we would be grateful  for the opportunity to address any problems.

## 2023-05-07 NOTE — Assessment & Plan Note (Signed)
Given the patient's history of substance use disorder and the request for a change in ADHD medication, a careful re-evaluation of his current ADHD management plan was warranted. While optimizing ADHD symptom control is a priority, the potential risks associated with changing to an immediate-release stimulant formulation must be carefully weighed. Consideration was given to adjusting the dose of the current medication under close supervision, exploring alternative extended-release stimulant formulations, or considering non-stimulant options.  The decision to grant the medication changes was accompanied by regular monitoring for signs of misuse or diversion, as well as ongoing support for the patient's recovery from substance use disorder, so only 30 day supply was given with expectation that he send pictures of medication(s) on MyChart half way through to demonstrate no diversion.

## 2023-05-07 NOTE — Assessment & Plan Note (Addendum)
He feels like the extended release does not work well for him and he has to switch to the immediate release and increase it to twice daily.  I agreed to this on the condition that he sent me pictures halfway through the month demonstrating that he is taking it as prescribed and this is mainly for a diversion prevention strategy to satisfy the DEA requirement.  I explained that he should take it only as directed and that failing to limit his dose to the prescribed amount puts him at risk of heart failure and psychosis that could wreck his life even resulting in death. The dose of this new prescription to just 1 month so we can see how it is going to work out and to ensure that he follows through with adherence to our diversion prevention strategy

## 2023-05-10 ENCOUNTER — Telehealth: Payer: Self-pay | Admitting: Internal Medicine

## 2023-05-10 NOTE — Telephone Encounter (Signed)
Caller states she wanted PCP to know that patient had started using opioids again but has been detoxing for the past couple of days. Pt has been scheduled for 5/13.

## 2023-05-13 ENCOUNTER — Ambulatory Visit: Payer: Medicaid Other | Admitting: Internal Medicine

## 2023-05-21 ENCOUNTER — Encounter: Payer: Self-pay | Admitting: Internal Medicine

## 2023-05-21 NOTE — Addendum Note (Signed)
Addended by: Donzetta Starch on: 05/21/2023 04:35 PM   Modules accepted: Orders

## 2023-05-23 ENCOUNTER — Other Ambulatory Visit: Payer: Medicaid Other

## 2023-05-23 DIAGNOSIS — F1190 Opioid use, unspecified, uncomplicated: Secondary | ICD-10-CM

## 2023-05-23 DIAGNOSIS — Z79899 Other long term (current) drug therapy: Secondary | ICD-10-CM | POA: Diagnosis not present

## 2023-05-28 LAB — TOXASSURE SELECT 12(MW)

## 2023-05-28 NOTE — Progress Notes (Signed)
Please reach out to patient with hopeful supportive messaging. Schedule him with high priority, double book if needed to get him in urgently.  Let him know we want the best for him and we're confident we can help him to get right back on track.

## 2023-06-01 DIAGNOSIS — Z419 Encounter for procedure for purposes other than remedying health state, unspecified: Secondary | ICD-10-CM | POA: Diagnosis not present

## 2023-06-04 ENCOUNTER — Encounter: Payer: Self-pay | Admitting: Internal Medicine

## 2023-06-04 ENCOUNTER — Ambulatory Visit (INDEPENDENT_AMBULATORY_CARE_PROVIDER_SITE_OTHER): Payer: Medicaid Other | Admitting: Internal Medicine

## 2023-06-04 VITALS — BP 120/96 | HR 121 | Temp 98.0°F | Ht 69.0 in | Wt 205.8 lb

## 2023-06-04 DIAGNOSIS — R110 Nausea: Secondary | ICD-10-CM | POA: Diagnosis not present

## 2023-06-04 DIAGNOSIS — F1193 Opioid use, unspecified with withdrawal: Secondary | ICD-10-CM | POA: Diagnosis not present

## 2023-06-04 DIAGNOSIS — J4541 Moderate persistent asthma with (acute) exacerbation: Secondary | ICD-10-CM

## 2023-06-04 DIAGNOSIS — F1120 Opioid dependence, uncomplicated: Secondary | ICD-10-CM

## 2023-06-04 MED ORDER — BUPRENORPHINE HCL-NALOXONE HCL 8-2 MG SL SUBL: 2.0000 | SUBLINGUAL_TABLET | Freq: Every day | SUBLINGUAL | 0 refills | Status: DC

## 2023-06-04 MED ORDER — CLONIDINE HCL 0.1 MG PO TABS: 0.1000 mg | ORAL_TABLET | Freq: Every evening | ORAL | 3 refills | Status: DC

## 2023-06-04 MED ORDER — ONDANSETRON HCL 4 MG PO TABS: 4.0000 mg | ORAL_TABLET | Freq: Three times a day (TID) | ORAL | 0 refills | Status: DC | PRN

## 2023-06-04 MED ORDER — PREDNISONE 20 MG PO TABS: ORAL_TABLET | ORAL | 0 refills | Status: DC

## 2023-06-04 NOTE — Progress Notes (Signed)
[facility]: [phone]   Routine Medical Office Visit  Patient:  Mitchell Nelson      Age: 28 y.o.       Sex:  male  Date:   06/04/2023 PCP:    Lula Olszewski, MD   Today's Healthcare Provider: Lula Olszewski, MD   Assessment and Plan:   Based on [employer] AI conversational text extraction: Assessment and Plan    Opioid Use Disorder: Recent severe relapse with binge use of heroin, fentanyl, and ecstasy. Currently in early withdrawal. Discussed the risks of home detox and the benefits of medication-assisted treatment. -Start Suboxone, half tablet daily, can increase up to 2 tablets per day as needed for withdrawal symptoms. -Return to clinic within a week for follow-up. -If Suboxone is not effective, consider inpatient detox. -Avoid concurrent use of benzodiazepines and alcohol due to risk of overdose.  Asthma: Recent exacerbation with cough and wheezing. No shortness of breath or fever. -Continue current inhaler. -Consider Prednisone if inhaler is not effective.  Nausea/Vomiting: Likely related to opioid withdrawal. -Prescribe Zofran for symptom management.  Hypertension: Likely related to opioid withdrawal. -Prescribe Clonidine for symptom management and to help with withdrawal symptoms.  General Health Maintenance / Followup Plans: -Plan for patient to travel to [place] in three weeks for a month, which may provide a supportive environment for sobriety. -Return to clinic in 1 week for follow-up. -Consider inpatient detox if outpatient detox with Suboxone is not effective. -Continue to avoid benzodiazepines and alcohol. -Check drug screen at next visit.      Based on updated problems and orders placed with problem based charting:  Assessment and Plan Mitchell Nelson was seen today for 1 month follow-up.  Opioid use disorder, severe, dependence [facility]) Overview: June 04, 2023 interim history:   reports relapse was severe with binge  starting after a single ecstasy.  Felt too comfortable.  Maybe guard was let down.  Has been at home since Friday.  Has been detoxing for about a month and a half.   Recent urine drug screen showed morphine/fentanyl/meth from ecstasy.  Last use 4 days ago. Detoxing at home. Woke up and vomiting this am.  Today and yesterday withdrawal as well.   Orders: -     Buprenorphine HCl-Naloxone HCl; Place 2 tablets under the tongue daily for 5 days.  Dispense: 10 tablet; Refill: 0  Opioid withdrawal (HCC) -     Buprenorphine HCl-Naloxone HCl; Place 2 tablets under the tongue daily for 5 days.  Dispense: 10 tablet; Refill: 0 -     cloNIDine HCl; Take 1 tablet (0.1 mg total) by mouth at bedtime.  Dispense: 20 tablet; Refill: 3  Moderate persistent asthma with exacerbation -     DG Chest 2 View; Future -     predniSONE; Take 2 pills for 3 days, 1 pill for 4 days  Dispense: 10 tablet; Refill: 0  Nausea -     Ondansetron HCl; Take 1 tablet (4 mg total) by mouth every 8 (eight) hours as needed for nausea or vomiting.  Dispense: 20 tablet; Refill: 0    Treatment plan discussed and reviewed in detail. Explained medication safety and potential side effects.  Answered all patient questions and confirmed understanding and comfort with the plan. Encouraged patient to contact our office if they have any questions or concerns. Agreed on patient returning to office if symptoms worsen, persist, or new symptoms develop. Discussed precautions in case of needing to visit the Emergency Department.  Focused on stabilizing patient today, will need more lab workup on return.  Medical Decision Making: 1 or more chronic illnesses with exacerbation,  progression, or side effects of treatment 1 acute illness with systemic symptoms Prescription drug management            Clinical Presentation:    28 y.o. male here today for 1 month follow-up  Based on Abridge AI conversational text extraction:  Discussed the use of AI  scribe software for clinical note transcription with the patient, who gave verbal consent to proceed.  History of Present Illness   The patient, with a history of severe opioid use disorder, presents with a recent relapse. The relapse was triggered by a single ecstasy pill, which led to a binge of heroin and fentanyl use. The patient has been detoxing at home for the past month and a half, with the last use being four days ago. They are currently experiencing early withdrawal symptoms, including vomiting and a persistent cough. The patient also reports hoarseness, which they attribute to the cough rather than the vomiting.  The patient has a history of intravenous drug use, with the most recent use being three weeks ago. They report using clean needles and deny any symptoms of fever. However, they have been experiencing wheeziness in their lungs, which could potentially indicate an infection.  The patient's family is aware of their condition, but their involvement has been a source of stress for the patient, triggering further drug use. The patient is planning to travel to [place] in three weeks, where they will not have access to drugs, in an attempt to break the cycle of addiction.        Reviewed chart data: Active Ambulatory Problems    Diagnosis Date Noted   ADD (attention deficit disorder) 03/28/2015   Cough variant asthma 12/03/2018   Obesity due to energy imbalance 10/23/2022   History of substance use disorder 10/23/2022   Substance induced mood disorder (HCC) 10/23/2022   Major depressive disorder with current active episode 10/23/2022   h/o IVDU (intravenous drug user) 10/23/2022   High risk medication use 12/06/2022   Opioid use disorder 12/06/2022   Wheezing 07/02/2020   Tobacco use disorder 09/09/2020   Opioid use disorder, severe, dependence (HCC) 07/01/2020   Moderate persistent asthma without complication 07/02/2020   Resolved Ambulatory Problems    Diagnosis Date  Noted   Asthma exacerbation 06/01/2019   Polycythemia 06/01/2019   Sinus tachycardia 06/01/2019   Other insomnia 10/23/2022   Does not have health insurance 02/11/2023   Past Medical History:  Diagnosis Date   Asthma     Outpatient Medications Prior to Visit  Medication Sig   Albuterol-Budesonide (AIRSUPRA) 90-80 MCG/ACT AERO Inhale 2 puffs into the lungs 4 (four) times daily as needed.   amphetamine-dextroamphetamine (ADDERALL) 20 MG tablet Take 1 tablet (20 mg total) by mouth 2 (two) times daily.   metFORMIN (GLUCOPHAGE) 500 MG tablet Take 1 tablet (500 mg total) by mouth 2 (two) times daily with a meal.   sertraline (ZOLOFT) 100 MG tablet Take 2 tablets (200 mg total) by mouth daily.   traZODone (DESYREL) 100 MG tablet Take 1 tablet (100 mg total) by mouth at bedtime.   No facility-administered medications prior to visit.         Clinical Data Analysis:   Physical Exam  BP (!) 120/96 (BP Location: Left Arm, Patient Position: Sitting)   Pulse (!) 121   Temp 98 F (36.7 C) (Temporal)  Ht 5\' 9"  (1.753 m)   Wt 205 lb 12.8 oz (93.4 kg)   SpO2 96%   BMI 30.39 kg/m  Wt Readings from Last 10 Encounters:  06/04/23 205 lb 12.8 oz (93.4 kg)  05/07/23 220 lb (99.8 kg)  03/11/23 222 lb 9.6 oz (101 kg)  02/11/23 223 lb 6.4 oz (101.3 kg)  01/09/23 211 lb 9.6 oz (96 kg)  12/06/22 218 lb 12.8 oz (99.2 kg)  11/06/22 227 lb (103 kg)  10/23/22 227 lb (103 kg)  06/15/20 195 lb 9.6 oz (88.7 kg)  04/27/20 194 lb 3.2 oz (88.1 kg)   Vital signs reviewed.  Nursing notes reviewed. Weight trend reviewed. Abnormalities and Problem-Specific physical exam findings:  cough,sneezing,wheezing in lungs, tachycardia with heart rate lability , needle track left arm General Appearance:  No acute distress appreciable.   Well-groomed, healthy-appearing male.  Well proportioned with no abnormal fat distribution.  Good muscle tone. Skin: Clear and well-hydrated. Pulmonary:  Normal work of breathing at  rest, no respiratory distress apparent. SpO2: 96 %  Musculoskeletal: All extremities are intact.  Neurological:  Awake, alert, oriented, and engaged.  No obvious focal neurological deficits or cognitive impairments.  Sensorium seems unclouded.   Speech is clear and coherent with logical content. Psychiatric:  Appropriate mood, pleasant and cooperative demeanor, thoughtful and engaged during the exam  Results Reviewed:    No results found for any visits on 06/04/23.  Recent Results (from the past 2160 hour(s))  ToxASSURE Select 12(MW)     Status: None   Collection Time: 05/23/23 11:19 AM  Result Value Ref Range   Summary Note     Comment: ==================================================================== ToxASSURE Select 12(MW) ==================================================================== Test                             Result       Flag       Units  Drug Present   Methamphetamine                1094                    ng/mg creat   Amphetamine                    3356                    ng/mg creat    Sources of methamphetamine include illicit sources, as a scheduled    prescription medication, as a metabolite of some prescription drugs,    or use of an l-methamphetamine inhaler.     Amphetamine is an expected metabolite of methamphetamine.    Amphetamine is also available as a schedule II prescription drug.    Morphine                       800                     ng/mg creat    Potential sources of morphine include administration of codeine or    morphine, use of heroin, or ingestion of poppy seeds.    Fentanyl                       1094                    ng/mg creat  Norfentanyl                     >2941                   ng/mg creat    Source of fentanyl is a scheduled prescription medication, including    IV, patch, and transmucosal formulations. Norfentanyl is an expected    metabolite of  fentanyl.  ==================================================================== Test                      Result    Flag   Units      Ref Range   Creatinine              34               mg/dL      >=60 ==================================================================== Declared Medications:  Medication list was not provided. ==================================================================== For clinical consultation, please call [phone]. ====================================================================     No image results found.   No results found.     Signed: Lula Olszewski, MD 06/04/2023 10:14 AM

## 2023-06-04 NOTE — Assessment & Plan Note (Signed)
Early withdrawal severe Agreed to suboxone today Advised patient to start with half tablet daily, ok to go up to 2 tablets per day Return to clinic within a week  If not working go to emergency room. Home detox its nice support but inadequate. Benzodiazepine and alcohol are deadly with any opioids suboxone/fentanyl/heroin.

## 2023-06-04 NOTE — Patient Instructions (Addendum)
It was a pleasure seeing you today! Your health and satisfaction are our top priorities.   Glenetta Hew, MD  VISIT SUMMARY:  During your visit, we discussed your recent relapse into opioid use, which has led to early withdrawal symptoms such as vomiting and a persistent cough. We also addressed your asthma symptoms and your plans to travel to [place] in an attempt to break the cycle of addiction.  YOUR PLAN:  -OPIOID USE DISORDER: You've had a severe relapse into opioid use. We've decided to start you on Suboxone, a medication that can help manage withdrawal symptoms. If this doesn't work, we may consider inpatient detox. It's important to avoid using benzodiazepines and alcohol as they can increase the risk of overdose.  -ASTHMA: Your asthma has been acting up recently, causing coughing and wheezing. Continue using your current inhaler, and if it's not effective, we may consider adding Prednisone, a medication that can help reduce inflammation in your lungs.  -NAUSEA/VOMITING: Your nausea and vomiting are likely due to opioid withdrawal. We're prescribing Zofran, a medication that can help manage these symptoms.  -HYPERTENSION: Your high blood pressure is likely due to opioid withdrawal. We're prescribing Clonidine, a medication that can help manage this symptom and also assist with withdrawal symptoms.  INSTRUCTIONS:  You're planning to travel to [place] in three weeks for a month, which may provide a supportive environment for sobriety. Please return to the clinic in 1 week for a follow-up. If outpatient detox with Suboxone is not effective, we may consider inpatient detox. Continue to avoid benzodiazepines and alcohol. We'll also check a drug screen at your next visit.   Next Steps:  [x]  Flexible Follow-Up: We recommend a follow up with your primary care, a specialist, or me within 1-2 weeks for ensuring your problem resolves. This allows for progress monitoring and treatment  adjustments. [x]  Early Intervention: Schedule sooner appointment, call our on-call services, or go to emergency room if there is Increase in pain or discomfort New or worsening symptoms Sudden or severe changes in your health [x]  Lab & X-ray Appointments: complete or schedule to complete today, or call to schedule.  X-rays: [facility] (M-F, 8:30am-noon or 1pm-5pm).  Making the Most of Our Focused (20 minute) Follow Up Appointments:  [x]   Clearly state your top concerns at the beginning of the visit to focus our discussion [x]   If you anticipate you will need more time, please inform the front desk during scheduling - we can book multiple appointments in the same week. [x]   If you have transportation problems- use our convenient video appointments or ask about transportation support. [x]   We can get down to business faster if you use MyChart to update information before the visit and submit non-urgent questions before your visit. Thank you for taking the time to provide details through MyChart.  Let our nurse know and she can import this information into your encounter documents.  Arrival and Wait Times: [x]   Arriving on time ensures that everyone receives prompt attention. [x]   Early morning (8a) and afternoon (1p) appointments tend to have shortest wait times. [x]   Unfortunately, we cannot delay appointments for late arrivals or hold slots during phone calls.  Getting Answers and Following Up  [x]   Simple Questions & Concerns: For quick questions or basic follow-up after your visit, reach [place] at [phone] or MyChart messaging. [x]   Complex Concerns: If your concern is more complex, scheduling an appointment might be best. Discuss this  with the staff to find the most suitable option. [x]   Lab & Imaging Results: We'll contact you directly if results are abnormal or you don't use MyChart. Most normal results will be on MyChart within 2-3 business days, with a review message  from Dr. Jon Billings. Haven't heard back in 2 weeks? Need results sooner? Contact us at [phone]. [x]   Referrals: Our referral coordinator will manage specialist referrals. The specialist's office should contact you within 2 weeks to schedule an appointment. Call us if you haven't heard from them after 2 weeks.  Staying Connected  [x]   MyChart: Activate your MyChart for the fastest way to access results and message [place]. See the last page of this paperwork for instructions on how to activate.  Bring to Your Next Appointment  [x]   Medications: Please bring all your medication bottles to your next appointment to ensure we have an accurate record of your prescriptions. [x]   Health Diaries: If you're monitoring any health conditions at home, keeping a diary of your readings can be very helpful for discussions at your next appointment.  Billing  [x]   X-ray & Lab Orders: These are billed by separate companies. Contact the invoicing company directly for questions or concerns. [x]   Visit Charges: Discuss any billing inquiries with our administrative services team.  Your Satisfaction Matters  [x]   Share Your Experience: We strive for your satisfaction! If you have any complaints, or preferably compliments, please let Dr. Jon Billings know directly or contact our Practice Administrators, Edwena Felty or [employer], by asking at the front desk.   Reviewing Your Records  [x]   Review this early draft of your clinical encounter notes below and the final encounter summary tomorrow on MyChart after its been completed.   Opioid use disorder, severe, dependence (HCC) -     Buprenorphine HCl-Naloxone HCl; Place 2 tablets under the tongue daily for 5 days.  Dispense: 10 tablet; Refill: 0 -     Ondansetron HCl; Take 1 tablet (4 mg total) by mouth every 8 (eight) hours as needed for nausea or vomiting.  Dispense: 20 tablet; Refill: 0  Opioid withdrawal (HCC) -     Buprenorphine HCl-Naloxone HCl; Place 2  tablets under the tongue daily for 5 days.  Dispense: 10 tablet; Refill: 0 -     Ondansetron HCl; Take 1 tablet (4 mg total) by mouth every 8 (eight) hours as needed for nausea or vomiting.  Dispense: 20 tablet; Refill: 0 -     cloNIDine HCl; Take 1 tablet (0.1 mg total) by mouth at bedtime.  Dispense: 20 tablet; Refill: 3  Moderate persistent asthma with exacerbation -     DG Chest 2 View; Future -     predniSONE; Take 2 pills for 3 days, 1 pill for 4 days  Dispense: 10 tablet; Refill: 0

## 2023-06-12 ENCOUNTER — Ambulatory Visit (INDEPENDENT_AMBULATORY_CARE_PROVIDER_SITE_OTHER): Payer: Medicaid Other | Admitting: Internal Medicine

## 2023-06-12 ENCOUNTER — Encounter: Payer: Self-pay | Admitting: Internal Medicine

## 2023-06-12 VITALS — BP 128/84 | HR 83 | Temp 98.2°F | Ht 69.0 in | Wt 216.2 lb

## 2023-06-12 DIAGNOSIS — F1190 Opioid use, unspecified, uncomplicated: Secondary | ICD-10-CM

## 2023-06-12 NOTE — Assessment & Plan Note (Addendum)
Discussed using Clinical Opioid Withdrawal Scale (COWS) to help know when to start subs Has them ready Has sister with narcan supportive Doing 12 steps, encouraged patient to set quit date. Discussed harm reduction at length. Explained methadone is a good option if can't transition to suboxone. Patient agreed to return in 2 weeks.  Opioid Use Disorder: They express a desire to quit opioids due to their negative impact on life and finances, acknowledging the futility of chasing the initial high. We discussed the irrationality of addiction and the importance of trigger management. They have Suboxone and Narcan on hand and are engaged in a 12-step program. We encouraged them to set a quit date and advised avoiding triggers, including potentially cutting off contact with triggering individuals. We discussed Methadone as a harm reduction strategy if the transition to Suboxone is unsuccessful and scheduled a follow-up in 2 weeks.  Harm Reduction: We discussed the importance of clean injection techniques to avoid infections, including infective endocarditis, as they reported previous signs of potential infection. We advised on clean injection techniques, including washing the injection site with soap and water, and to seek medical attention if signs of infection reappear.

## 2023-06-12 NOTE — Progress Notes (Signed)
[facility]: [phone]   Routine Medical Office Visit  Patient:  Mitchell Nelson      Age: 28 y.o.       Sex:  male  Date:   06/12/2023 PCP:    Lula Olszewski, MD   Today's Healthcare Provider: Lula Olszewski, MD   Assessment and Plan:    Lashad was seen today for one week follow-up.  Opioid use disorder Overview: June 12, 2023 interim history:   yet to begin suboxone.  Still using actively. Know to wait until 48 hour  history of bad experience when detoxing with subs.   05/07/2023 Interim history: He reports he has not had any slips since our last visit and he is working with his sponsor and in the program and is not having problems with triggers peoples places things or any other issues at this time meeting every sunday   Prior history:  Repeatedly  declined  Suboxone or Vivitrol for treatment of his opioid use disorder  He also follows with a sponsor which I's encouraged him to keep doing as well as continue to make use of his outstanding family support.  Use disorder is severe.  IVDU and polysubstance use history   Assessment & Plan: Discussed using Clinical Opioid Withdrawal Scale (COWS) to help know when to start subs Has them ready Has sister with narcan supportive Doing 12 steps, encouraged patient to set quit date. Discussed harm reduction at length. Explained methadone is a good option if can't transition to suboxone. Patient agreed to return in 2 weeks.  Opioid Use Disorder: They express a desire to quit opioids due to their negative impact on life and finances, acknowledging the futility of chasing the initial high. We discussed the irrationality of addiction and the importance of trigger management. They have Suboxone and Narcan on hand and are engaged in a 12-step program. We encouraged them to set a quit date and advised avoiding triggers, including potentially cutting off contact with triggering individuals. We discussed  Methadone as a harm reduction strategy if the transition to Suboxone is unsuccessful and scheduled a follow-up in 2 weeks.  Harm Reduction: We discussed the importance of clean injection techniques to avoid infections, including infective endocarditis, as they reported previous signs of potential infection. We advised on clean injection techniques, including washing the injection site with soap and water, and to seek medical attention if signs of infection reappear.            Clinical Presentation:    28 y.o. male here today for One week follow-up  AI-Extracted: Discussed the use of AI scribe software for clinical note transcription with the patient, who gave verbal consent to proceed.  History of Present Illness   The patient, with a history of opioid use disorder, presents with a desire to quit due to dissatisfaction with their current lifestyle and the realization that the pleasure derived from opioid use is unattainable. They express a sense of emptiness and frustration from the constant pursuit of a high that they acknowledge will never be replicated. The patient has had a negative experience with detoxing using Suboxone in the past. They have Narcan on hand, provided by their supportive sister, and are participating in a 12-step program.  The patient also reports a recent episode of skin spots following intravenous drug use, which they suspect might have been due to unclean technique or contaminated substance. They deny any current signs of infection. They have declined the offer for blood  cultures to check for possible bloodstream infection.  The patient is considering a transition to methadone if they continue to struggle with transitioning to Suboxone.        Reviewed chart data: Active Ambulatory Problems    Diagnosis Date Noted   ADD (attention deficit disorder) 03/28/2015   Cough variant asthma 12/03/2018   Obesity due to energy imbalance 10/23/2022   History of substance  use disorder 10/23/2022   Substance induced mood disorder (HCC) 10/23/2022   Major depressive disorder with current active episode 10/23/2022   h/o IVDU (intravenous drug user) 10/23/2022   High risk medication use 12/06/2022   Opioid use disorder 12/06/2022   Wheezing 07/02/2020   Tobacco use disorder 09/09/2020   Opioid use disorder, severe, dependence (HCC) 07/01/2020   Moderate persistent asthma without complication 07/02/2020   Resolved Ambulatory Problems    Diagnosis Date Noted   Asthma exacerbation 06/01/2019   Polycythemia 06/01/2019   Sinus tachycardia 06/01/2019   Other insomnia 10/23/2022   Does not have health insurance 02/11/2023   Past Medical History:  Diagnosis Date   Asthma     Outpatient Medications Prior to Visit  Medication Sig   Albuterol-Budesonide (AIRSUPRA) 90-80 MCG/ACT AERO Inhale 2 puffs into the lungs 4 (four) times daily as needed.   amphetamine-dextroamphetamine (ADDERALL) 20 MG tablet Take 1 tablet (20 mg total) by mouth 2 (two) times daily.   cloNIDine (CATAPRES) 0.1 MG tablet Take 1 tablet (0.1 mg total) by mouth at bedtime.   metFORMIN (GLUCOPHAGE) 500 MG tablet Take 1 tablet (500 mg total) by mouth 2 (two) times daily with a meal.   ondansetron (ZOFRAN) 4 MG tablet Take 1 tablet (4 mg total) by mouth every 8 (eight) hours as needed for nausea or vomiting.   predniSONE (DELTASONE) 20 MG tablet Take 2 pills for 3 days, 1 pill for 4 days   sertraline (ZOLOFT) 100 MG tablet Take 2 tablets (200 mg total) by mouth daily.   traZODone (DESYREL) 100 MG tablet Take 1 tablet (100 mg total) by mouth at bedtime.   No facility-administered medications prior to visit.         Clinical Data Analysis:   Physical Exam  BP 128/84   Pulse 83   Temp 98.2 F (36.8 C) (Temporal)   Ht 5\' 9"  (1.753 m)   Wt 216 lb 3.2 oz (98.1 kg)   SpO2 97%   BMI 31.93 kg/m  Wt Readings from Last 10 Encounters:  06/12/23 216 lb 3.2 oz (98.1 kg)  06/04/23 205 lb 12.8 oz  (93.4 kg)  05/07/23 220 lb (99.8 kg)  03/11/23 222 lb 9.6 oz (101 kg)  02/11/23 223 lb 6.4 oz (101.3 kg)  01/09/23 211 lb 9.6 oz (96 kg)  12/06/22 218 lb 12.8 oz (99.2 kg)  11/06/22 227 lb (103 kg)  10/23/22 227 lb (103 kg)  06/15/20 195 lb 9.6 oz (88.7 kg)   Vital signs reviewed.  Nursing notes reviewed. Weight trend reviewed. Abnormalities and Problem-Specific physical exam findings:  appears less well-kempt than usual.  General Appearance:  No acute distress appreciable.   Well-groomed, healthy-appearing male.  Well proportioned with no abnormal fat distribution.  Good muscle tone. Skin: Clear and well-hydrated. Pulmonary:  Normal work of breathing at rest, no respiratory distress apparent. SpO2: 97 %  Musculoskeletal: All extremities are intact.  Neurological:  Awake, alert, oriented, and engaged.  No obvious focal neurological deficits or cognitive impairments.  Sensorium seems unclouded.   Speech is clear and  coherent with logical content. Psychiatric:  Appropriate mood, pleasant and cooperative demeanor, thoughtful and engaged during the exam  Results Reviewed:    No results found for any visits on 06/12/23.  Recent Results (from the past 2160 hour(s))  ToxASSURE Select 12(MW)     Status: None   Collection Time: 05/23/23 11:19 AM  Result Value Ref Range   Summary Note     Comment: ==================================================================== ToxASSURE Select 12(MW) ==================================================================== Test                             Result       Flag       Units  Drug Present   Methamphetamine                1094                    ng/mg creat   Amphetamine                    3356                    ng/mg creat    Sources of methamphetamine include illicit sources, as a scheduled    prescription medication, as a metabolite of some prescription drugs,    or use of an l-methamphetamine inhaler.     Amphetamine is an expected  metabolite of methamphetamine.    Amphetamine is also available as a schedule II prescription drug.    Morphine                       800                     ng/mg creat    Potential sources of morphine include administration of codeine or    morphine, use of heroin, or ingestion of poppy seeds.    Fentanyl                       1094                    ng/mg creat   Norfentanyl                     >2941                   ng/mg creat    Source of fentanyl is a scheduled prescription medication, including    IV, patch, and transmucosal formulations. Norfentanyl is an expected    metabolite of fentanyl.  ==================================================================== Test                      Result    Flag   Units      Ref Range   Creatinine              34               mg/dL      >=16 ==================================================================== Declared Medications:  Medication list was not provided. ==================================================================== For clinical consultation, please call [phone]. ====================================================================     On the day of the visit, I dedicated 21 minutes to both direct and indirect patient care activities.  The time was spent: Communication with Patient: I communicated the clinical situation to the patient, their family, or caregiver to keep them  informed about the patient's health status Counseling and Education: I provided counseling and education to the patient, their family, or caregiver to empower them in managing the patient's health Data Synthesis: I synthesized, documented, and evaluated the available clinical information in the Electronic Medical Record (EMR) to gain a comprehensive understanding of the patient's health Examination: I performed a medically appropriate examination to assess the patient's current health condition Patient History: I obtained, documented, and reviewed  an updated medical history to understand any recent changes in the patient's health Preparation: I reviewed the patient's medical records both before and during the encounter to ensure I was fully informed about their health status Treatment Plan: I worked collaboratively with the patient to develop and communicate an individualized treatment plan.  This time was spent independently of any separately billable procedures. Please note that this statement is intended to provide a clear and comprehensive account of the time and services provided during the patient's visit. The extended time was medically necessary because: Comprehensive Mental Health Care: Addressing mental health concerns that require comprehensive treatment plans, such as severe depression, anxiety, stress, or personality issues.  and Social Determinants of Health: Considering social determinants of health, such as living conditions and socioeconomic status, in the patient's care plan.  This encounter employed real-time, collaborative documentation. The patient actively reviewed and updated their medical record on a shared screen, ensuring transparency and facilitating joint problem-solving for the problem list, overview, and plan. This approach promotes accurate, informed care. The treatment plan was discussed and reviewed in detail, including medication safety, potential side effects, and all patient questions. We confirmed understanding and comfort with the plan. Follow-up instructions were established, including contacting the office for any concerns, returning if symptoms worsen, persist, or new symptoms develop, and precautions for potential emergency department visits. ----------------------------------------------------- Lula Olszewski, MD  06/12/2023 7:10 PM  [facility]:  [phone]

## 2023-06-12 NOTE — Patient Instructions (Addendum)
It was a pleasure seeing you today! Your health and satisfaction are our top priorities.   Glenetta Hew, MD  VISIT SUMMARY:  During our visit, we discussed your desire to quit opioids and the challenges you've faced in doing so. We also talked about your recent skin spots following intravenous drug use and your past withdrawal from SSRIs. We discussed various strategies to help you quit opioids, the importance of clean injection techniques, and the possibility of getting an HPV vaccination.  YOUR PLAN:  -OPIOID USE DISORDER: Opioid Use Disorder is a problematic pattern of opioid use leading to significant impairment or distress. We discussed setting a quit date and avoiding triggers, including potentially cutting off contact with triggering individuals. We also discussed the possibility of using Methadone as a harm reduction strategy if transitioning to Suboxone is unsuccessful.  Inpatient detox or visiting [place] are also great options.  -HARM REDUCTION: Harm Reduction refers to policies, programs, and practices that aim to minimize negative health, social, and legal impacts associated with drug use. We discussed the importance of clean injection techniques to avoid infections and advised you to seek medical attention if signs of infection reappear.  We encouraged you to keep narcan available, and always use tester doses.   INSTRUCTIONS:  We have scheduled a follow-up appointment in 2 weeks to check on your progress and discuss any concerns or issues you may have. In the meantime, remember to practice clean administration techniques and seek medical attention if signs of infection reappear.  Give it a good try to transition to regular suboxone use, by setting a quit date for opioids and think about the possibility of getting an HPV vaccination.  Next Steps:  [x]  Early Intervention: Schedule sooner appointment, call our on-call services, or go to emergency room if there is Increase in pain or  discomfort New or worsening symptoms Sudden or severe changes in your health [x]  Flexible Follow-Up: We recommend a Return in about 2 weeks (around 06/26/2023) for close follow up acute illness, chronic disease monitoring and management. for optimal routine care. This allows for progress monitoring and treatment adjustments. [x]  Preventive Care: Schedule your annual preventive care visit! It's typically covered by insurance and helps identify potential health issues early. [x]  Lab & X-ray Appointments: Incomplete tests scheduled today, or call to schedule. X-rays: [facility] (M-F, 8:30am-noon or 1pm-5pm). [x]  Medical Information Release: Sign a release form at front desk to obtain relevant medical information we don't have.  Making the Most of Our Focused (20 minute) Appointments:  [x]   Clearly state your top concerns at the beginning of the visit to focus our discussion [x]   If you anticipate you will need more time, please inform the front desk during scheduling - we can book multiple appointments in the same week. [x]   If you have transportation problems- use our convenient video appointments or ask about transportation support. [x]   We can get down to business faster if you use MyChart to update information before the visit and submit non-urgent questions before your visit. Thank you for taking the time to provide details through MyChart.  Let our nurse know and she can import this information into your encounter documents.  Arrival and Wait Times: [x]   Arriving on time ensures that everyone receives prompt attention. [x]   Early morning (8a) and afternoon (1p) appointments tend to have shortest wait times. [x]   Unfortunately, we cannot delay appointments for late arrivals or hold slots during phone calls.  Getting Answers and  Following Up  [x]   Simple Questions & Concerns: For quick questions or basic follow-up after your visit, reach [place] at [phone] or MyChart  messaging. [x]   Complex Concerns: If your concern is more complex, scheduling an appointment might be best. Discuss this with the staff to find the most suitable option. [x]   Lab & Imaging Results: We'll contact you directly if results are abnormal or you don't use MyChart. Most normal results will be on MyChart within 2-3 business days, with a review message from Dr. Jon Billings. Haven't heard back in 2 weeks? Need results sooner? Contact us at [phone]. [x]   Referrals: Our referral coordinator will manage specialist referrals. The specialist's office should contact you within 2 weeks to schedule an appointment. Call us if you haven't heard from them after 2 weeks.  Staying Connected  [x]   MyChart: Activate your MyChart for the fastest way to access results and message [place]. See the last page of this paperwork for instructions on how to activate.  Bring to Your Next Appointment  [x]   Medications: Please bring all your medication bottles to your next appointment to ensure we have an accurate record of your prescriptions. [x]   Health Diaries: If you're monitoring any health conditions at home, keeping a diary of your readings can be very helpful for discussions at your next appointment.  Billing  [x]   X-ray & Lab Orders: These are billed by separate companies. Contact the invoicing company directly for questions or concerns. [x]   Visit Charges: Discuss any billing inquiries with our administrative services team.  Your Satisfaction Matters  [x]   Share Your Experience: We strive for your satisfaction! If you have any complaints, or preferably compliments, please let Dr. Jon Billings know directly or contact our Practice Administrators, Edwena Felty or [employer], by asking at the front desk.   Reviewing Your Records  [x]   Review this early draft of your clinical encounter notes below and the final encounter summary tomorrow on MyChart after its been completed.   Opioid use  disorder Assessment & Plan: Discussed using Clinical Opioid Withdrawal Scale (COWS) to help know when to start subs Has them ready Has sister with narcan supportive Doing 12 steps, encouraged patient to set quit date. Discussed harm reduction at length. Explained methadone is a good option if can't transition to suboxone. Patient agreed to return in 2 weeks.

## 2023-06-25 ENCOUNTER — Ambulatory Visit (INDEPENDENT_AMBULATORY_CARE_PROVIDER_SITE_OTHER): Payer: Medicaid Other | Admitting: Internal Medicine

## 2023-06-25 ENCOUNTER — Encounter: Payer: Self-pay | Admitting: Internal Medicine

## 2023-06-25 VITALS — BP 112/84 | HR 78 | Temp 97.9°F | Ht 69.0 in | Wt 220.6 lb

## 2023-06-25 DIAGNOSIS — J45991 Cough variant asthma: Secondary | ICD-10-CM | POA: Diagnosis not present

## 2023-06-25 DIAGNOSIS — R110 Nausea: Secondary | ICD-10-CM

## 2023-06-25 DIAGNOSIS — F1190 Opioid use, unspecified, uncomplicated: Secondary | ICD-10-CM

## 2023-06-25 DIAGNOSIS — Z79899 Other long term (current) drug therapy: Secondary | ICD-10-CM

## 2023-06-25 MED ORDER — MECLIZINE HCL 25 MG PO TABS: 25.0000 mg | ORAL_TABLET | Freq: Three times a day (TID) | ORAL | 1 refills | Status: DC | PRN

## 2023-06-25 MED ORDER — ONDANSETRON HCL 4 MG PO TABS: 4.0000 mg | ORAL_TABLET | Freq: Three times a day (TID) | ORAL | 0 refills | Status: DC | PRN

## 2023-06-25 MED ORDER — BUPRENORPHINE HCL-NALOXONE HCL 8-2 MG SL SUBL: 0.5000 | SUBLINGUAL_TABLET | Freq: Every day | SUBLINGUAL | 0 refills | Status: DC

## 2023-06-25 NOTE — Assessment & Plan Note (Signed)
Asthma: Their wheezing is likely exacerbated by cigarette smoking. They are encouraged to use an over-the-counter inhaler as needed and consider switching to a different form of nicotine to reduce wheezing.

## 2023-06-25 NOTE — Patient Instructions (Addendum)
It was a pleasure seeing you today! Your health and satisfaction are our top priorities.  Glenetta Hew, MD  Your Providers PCP: Lula Olszewski, MD,  [phone]) Referring Provider: Lula Olszewski, MD,  [phone])  VISIT SUMMARY:  During your visit, we discussed your concerns about opioid withdrawal symptoms, your history of smoking cigarettes, and your upcoming trip abroad. We also talked about your history of stimulant use and your current efforts towards recovery.  YOUR PLAN:  -OPIOID USE DISORDER: You experienced withdrawal symptoms after taking Suboxone, which is likely due to your previous opioid use. We will continue with Suboxone, starting with a smaller dose and gradually increasing it as needed. It's important to engage in recovery activities and support groups, and to understand the importance of consistent Suboxone use and the risks of relapse and overdose if not in recovery.  -NAUSEA AND VOMITING: Your nausea and vomiting are likely related to opioid withdrawal. We will continue with Zofran as needed for nausea and consider over-the-counter meclizine for additional relief.  -ASTHMA: Your wheezing is likely worsened by cigarette smoking. You are encouraged to use an over-the-counter inhaler as needed and consider switching to a different form of nicotine to reduce wheezing.   INSTRUCTIONS:  We have scheduled a follow-up appointment for 1-1.5 months after your return from your trip. In the meantime, please continue with the prescribed medications and recovery activities. A urine check for methadone is necessary. If you have any concerns or if your symptoms worsen, please contact the clinic immediately.   NEXT STEPS: [x]  Early Intervention: Schedule sooner appointment, call our on-call services, or go to emergency room if there is any significant Increase in pain or discomfort New or worsening symptoms Sudden or severe changes in your health [x]  Flexible Follow-Up:  We recommend a No follow-ups on file. for optimal routine care. This allows for progress monitoring and treatment adjustments. [x]  Preventive Care: Schedule your annual preventive care visit! It's typically covered by insurance and helps identify potential health issues early. [x]  Lab & X-ray Appointments: Incomplete tests scheduled today, or call to schedule. X-rays: [facility] (M-F, 8:30am-noon or 1pm-5pm). [x]  Medical Information Release: Sign a release form at front desk to obtain relevant medical information we don't have.  MAKING THE MOST OF OUR FOCUSED 20 MINUTE APPOINTMENTS: [x]   Clearly state your top concerns at the beginning of the visit to focus our discussion [x]   If you anticipate you will need more time, please inform the front desk during scheduling - we can book multiple appointments in the same week. [x]   If you have transportation problems- use our convenient video appointments or ask about transportation support. [x]   We can get down to business faster if you use MyChart to update information before the visit and submit non-urgent questions before your visit. Thank you for taking the time to provide details through MyChart.  Let our nurse know and she can import this information into your encounter documents.  Arrival and Wait Times: [x]   Arriving on time ensures that everyone receives prompt attention. [x]   Early morning (8a) and afternoon (1p) appointments tend to have shortest wait times. [x]   Unfortunately, we cannot delay appointments for late arrivals or hold slots during phone calls.  Getting Answers and Following Up [x]   Simple Questions & Concerns: For quick questions or basic follow-up after your visit, reach [place] at [phone] or MyChart messaging. [x]   Complex Concerns: If your concern is more complex, scheduling an appointment might  be best. Discuss this with the staff to find the most suitable option. [x]   Lab & Imaging Results: We'll contact  you directly if results are abnormal or you don't use MyChart. Most normal results will be on MyChart within 2-3 business days, with a review message from Dr. Jon Billings. Haven't heard back in 2 weeks? Need results sooner? Contact us at [phone]. [x]   Referrals: Our referral coordinator will manage specialist referrals. The specialist's office should contact you within 2 weeks to schedule an appointment. Call us if you haven't heard from them after 2 weeks.  Staying Connected [x]   MyChart: Activate your MyChart for the fastest way to access results and message [place]. See the last page of this paperwork for instructions on how to activate.  Bring to Your Next Appointment [x]   Medications: Please bring all your medication bottles to your next appointment to ensure we have an accurate record of your prescriptions. [x]   Health Diaries: If you're monitoring any health conditions at home, keeping a diary of your readings can be very helpful for discussions at your next appointment.  Billing [x]   X-ray & Lab Orders: These are billed by separate companies. Contact the invoicing company directly for questions or concerns. [x]   Visit Charges: Discuss any billing inquiries with our administrative services team.  Your Satisfaction Matters [x]   Share Your Experience: We strive for your satisfaction! If you have any complaints, or preferably compliments, please let Dr. Jon Billings know directly or contact our Practice Administrators, Edwena Felty or [employer], by asking at the front desk.   Reviewing Your Records [x]   Review this early draft of your clinical encounter notes below and the final encounter summary tomorrow on MyChart after its been completed.  All orders placed so far are visible here: Opioid use disorder Assessment & Plan: Opioid Use Disorder: They experienced precipitated withdrawal after taking Suboxone, likely due to long-acting opioid use. We will continue Suboxone, starting with a  quarter of a tablet and gradually increasing the dose as needed. A urine check for methadone is necessary. They are encouraged to engage in recovery activities and support groups, discussing the importance of consistent Suboxone use and the risks of relapse and overdose if not in recovery.  Nausea and Vomiting: This condition is likely related to opioid withdrawal. We will continue Zofran as needed for nausea and consider over-the-counter meclizine for additional relief.  We will continue withholding Adderall until evidence of sustained recovery is demonstrated.   Orders: -     Buprenorphine HCl-Naloxone HCl; Place 0.5 tablets under the tongue daily.  Dispense: 14 tablet; Refill: 0  Nausea -     Ondansetron HCl; Take 1 tablet (4 mg total) by mouth every 8 (eight) hours as needed for nausea or vomiting.  Dispense: 90 tablet; Refill: 0 -     Meclizine HCl; Take 1 tablet (25 mg total) by mouth 3 (three) times daily as needed for dizziness.  Dispense: 30 tablet; Refill: 1  Cough variant asthma Assessment & Plan: Asthma: Their wheezing is likely exacerbated by cigarette smoking. They are encouraged to use an over-the-counter inhaler as needed and consider switching to a different form of nicotine to reduce wheezing.

## 2023-06-25 NOTE — Assessment & Plan Note (Addendum)
Opioid Use Disorder: They experienced precipitated withdrawal after taking Suboxone, likely due to long-acting opioid use. We will continue Suboxone, starting with a quarter of a tablet and gradually increasing the dose as needed. A urine check for methadone is necessary. They are encouraged to engage in recovery activities and support groups, discussing the importance of consistent Suboxone use and the risks of relapse and overdose if not in recovery.  Nausea and Vomiting: This condition is likely related to opioid withdrawal. We will continue Zofran as needed for nausea and consider over-the-counter meclizine for additional relief.  We will continue withholding Adderall until evidence of sustained recovery is demonstrated.

## 2023-06-25 NOTE — Assessment & Plan Note (Signed)
Assessment: Risks and benefits were weighed and continued maintenance of the controlled substance prescription will be provided.    Relevant comorbid conditions include polysubstance use history, history of overdose accidental 12/2022  These factors are taken into account in the overall treatment plan to minimize potential interactions or complications.  No evidence of misuse or diversion has been identified during this visit, and there is no suspicion of such behavior.  His history of adherence to our prescribing agreement has recently less than ideal due to relapse to substance use but there has been no indications of stimulant misuse or  stimulant diversion.  Still we are holding Adderall until sustained recovery is made and we will continue to monitor and support his treatment plan, ensuring compliance with safety protocols and regulatory requirements.  Plan: Continued education about risks and benefits and safe use was also provided.  Importance of him securing his medication has been reviewed.  Due to sedatives, patient was instructed, not to drive, operate heavy machinery, perform activities at heights, swimming or participation in water activities or provide baby-sitting services while on Pain, Sleep and Anxiety Medications;  Also recommended to not to take more than prescribed Pain, Sleep and Anxiety Medications, not to mix any such medicines with each other or alcohol.    PDMP reviewed during this encounter. Reviewed Urine Drug Screening Data in problem overview

## 2023-06-25 NOTE — Progress Notes (Signed)
[facility]: [phone]   Routine Medical Office Visit  Patient:  Mitchell Nelson      Age: 28 y.o.       Sex:  male  Date:   06/25/2023 Patient Care Team: Mitchell Olszewski, MD as PCP - General (Internal Medicine) Today's Healthcare Provider: Lula Olszewski, MD   Assessment and Plan:    Mitchell [facility] was seen today for 2 week follow-up.  Opioid use disorder Overview: June 25, 2023 interim history tried suboxone but made withdrawal worse after 3 days withdrawal with just 0.5 film and he hated the taste.    June 12, 2023 interim history:   yet to begin suboxone.  Still using actively. Know to wait until 48 hour  history of bad experience when detoxing with subs.   05/07/2023 Interim history: He reports he has not had any slips since our last visit and he is working with his sponsor and in the program and is not having problems with triggers peoples places things or any other issues at this time meeting every sunday   Prior history:  Repeatedly  declined  Suboxone or Vivitrol for treatment of his opioid use disorder  He also follows with a sponsor which I's encouraged him to keep doing as well as continue to make use of his outstanding family support.  Use disorder is severe.  IVDU and polysubstance use history   Assessment & Plan: Opioid Use Disorder: They experienced precipitated withdrawal after taking Suboxone, likely due to long-acting opioid use. We will continue Suboxone, starting with a quarter of a tablet and gradually increasing the dose as needed. A urine check for methadone is necessary. They are encouraged to engage in recovery activities and support groups, discussing the importance of consistent Suboxone use and the risks of relapse and overdose if not in recovery.  Nausea and Vomiting: This condition is likely related to opioid withdrawal. We will continue Zofran as needed for nausea and consider over-the-counter meclizine for additional  relief.  We will continue withholding Adderall until evidence of sustained recovery is demonstrated.   Orders: -     Buprenorphine HCl-Naloxone HCl; Place 0.5 tablets under the tongue daily.  Dispense: 14 tablet; Refill: 0  Nausea -     Ondansetron HCl; Take 1 tablet (4 mg total) by mouth every 8 (eight) hours as needed for nausea or vomiting.  Dispense: 90 tablet; Refill: 0 -     Meclizine HCl; Take 1 tablet (25 mg total) by mouth 3 (three) times daily as needed for dizziness.  Dispense: 30 tablet; Refill: 1  Cough variant asthma Overview: Interim history: Was wheezy today and it is due to allergies he is not that he does not have a rescue inhaler  -Aggravated by Vape exposure 09/2018 / steroid responsive - Spirometry 12/02/2018  FEV1 3.3 (89%)  Ratio 78 s curvature 2 d p completed pred taper  - 12/02/2018  After extensive coaching inhaler device,  effectiveness = 75% > try sample of asmanex x 4 weeks then rtc     Assessment & Plan: Asthma: Their wheezing is likely exacerbated by cigarette smoking. They are encouraged to use an over-the-counter inhaler as needed and consider switching to a different form of nicotine to reduce wheezing.   High risk medication use Overview: Indication for controlled substance: Attention Deficit Hyperactivity Disorder (ADHD)  Medication and dose: see medication list- stimulant # pills per month: 30 Last UDS date: 12/06/22  office and 01/06/23 emergency room visit Controlled substance  contract signed (Y/N): Yes, scanned  PDMP last reviewed (include red flags): PDMP reviewed during this encounter.  Diversion Prevention:  warned of risk of discontinuation with legal or substance use issues,  we have signed contract,  plan continued intermittent random drug screens and pill counts.  He will return to clinic more frequently than typical patients due to history of substance use disorder and is expected to stay in remission as a condition of continued medication  management for Attention Deficit Hyperactivity Disorder (ADHD).      Assessment & Plan: Assessment: Risks and benefits were weighed and continued maintenance of the controlled substance prescription will be provided.    Relevant comorbid conditions include polysubstance use history, history of overdose accidental 12/2022  These factors are taken into account in the overall treatment plan to minimize potential interactions or complications.  No evidence of misuse or diversion has been identified during this visit, and there is no suspicion of such behavior.  His history of adherence to our prescribing agreement has recently less than ideal due to relapse to substance use but there has been no indications of stimulant misuse or  stimulant diversion.  Still we are holding Adderall until sustained recovery is made and we will continue to monitor and support his treatment plan, ensuring compliance with safety protocols and regulatory requirements.  Plan: Continued education about risks and benefits and safe use was also provided.  Importance of him securing his medication has been reviewed.  Due to sedatives, patient was instructed, not to drive, operate heavy machinery, perform activities at heights, swimming or participation in water activities or provide baby-sitting services while on Pain, Sleep and Anxiety Medications;  Also recommended to not to take more than prescribed Pain, Sleep and Anxiety Medications, not to mix any such medicines with each other or alcohol.    PDMP reviewed during this encounter. Reviewed Urine Drug Screening Data in problem overview           Clinical Presentation:    28 y.o. male here today for 2 Week Follow-up  AI-Extracted: Discussed the use of AI scribe software for clinical note transcription with the patient, who gave verbal consent to proceed.  History of Present Illness   The patient, with a history of opioid use disorder, presented with concerns about recent  withdrawal symptoms. They reported attempting to self-medicate with Suboxone, but experienced worsening withdrawal symptoms, including excessive sweating and vomiting, after taking half of an 8mg  tablet four days after their last opioid use. The patient noted that they were not in severe withdrawal at the time of taking the Suboxone, but their symptoms significantly worsened after ingestion.  The patient also reported a history of smoking cigarettes, particularly during periods of withdrawal, which has led to recent wheezing. They have been managing this with an over-the-counter inhaler.  The patient is planning a trip abroad for a month and expressed concerns about managing withdrawal symptoms during this time. They have previously tried to detoxify from opioids, but found the process challenging due to the severity of withdrawal symptoms and cravings.  The patient has a history of stimulant use and has been prescribed Adderall in the past. However, it was held recently due to concerns about return to polysubstance use and the need to demonstrate a period of sustained recovery.  The patient has been using online recovery resources and has a sponsor to support their recovery efforts. They reported a decrease in cravings for their drug of choice after approximately 1.5 to 3  years of sobriety.     Reviewed chart data: Active Ambulatory Problems    Diagnosis Date Noted   ADD (attention deficit disorder) 03/28/2015   Cough variant asthma 12/03/2018   Obesity due to energy imbalance 10/23/2022   History of substance use disorder 10/23/2022   Substance induced mood disorder (HCC) 10/23/2022   Major depressive disorder with current active episode 10/23/2022   h/o IVDU (intravenous drug user) 10/23/2022   High risk medication use 12/06/2022   Opioid use disorder 12/06/2022   Wheezing 07/02/2020   Tobacco use disorder 09/09/2020   Opioid use disorder, severe, dependence (HCC) 07/01/2020   Moderate  persistent asthma without complication 07/02/2020   Resolved Ambulatory Problems    Diagnosis Date Noted   Asthma exacerbation 06/01/2019   Polycythemia 06/01/2019   Sinus tachycardia 06/01/2019   Other insomnia 10/23/2022   Does not have health insurance 02/11/2023   Past Medical History:  Diagnosis Date   Asthma     Outpatient Medications Prior to Visit  Medication Sig   Albuterol-Budesonide (AIRSUPRA) 90-80 MCG/ACT AERO Inhale 2 puffs into the lungs 4 (four) times daily as needed.   amphetamine-dextroamphetamine (ADDERALL) 20 MG tablet Take 1 tablet (20 mg total) by mouth 2 (two) times daily.   cloNIDine (CATAPRES) 0.1 MG tablet Take 1 tablet (0.1 mg total) by mouth at bedtime.   metFORMIN (GLUCOPHAGE) 500 MG tablet Take 1 tablet (500 mg total) by mouth 2 (two) times daily with a meal.   predniSONE (DELTASONE) 20 MG tablet Take 2 pills for 3 days, 1 pill for 4 days   sertraline (ZOLOFT) 100 MG tablet Take 2 tablets (200 mg total) by mouth daily.   traZODone (DESYREL) 100 MG tablet Take 1 tablet (100 mg total) by mouth at bedtime.   [DISCONTINUED] ondansetron (ZOFRAN) 4 MG tablet Take 1 tablet (4 mg total) by mouth every 8 (eight) hours as needed for nausea or vomiting.   No facility-administered medications prior to visit.        Clinical Data Analysis:   Physical Exam  BP 112/84 (BP Location: Left Arm, Patient Position: Sitting)   Pulse 78   Temp 97.9 F (36.6 C) (Temporal)   Ht 5\' 9"  (1.753 m)   Wt 220 lb 9.6 oz (100.1 kg)   SpO2 94%   BMI 32.58 kg/m  Wt Readings from Last 10 Encounters:  06/25/23 220 lb 9.6 oz (100.1 kg)  06/12/23 216 lb 3.2 oz (98.1 kg)  06/04/23 205 lb 12.8 oz (93.4 kg)  05/07/23 220 lb (99.8 kg)  03/11/23 222 lb 9.6 oz (101 kg)  02/11/23 223 lb 6.4 oz (101.3 kg)  01/09/23 211 lb 9.6 oz (96 kg)  12/06/22 218 lb 12.8 oz (99.2 kg)  11/06/22 227 lb (103 kg)  10/23/22 227 lb (103 kg)   Vital signs reviewed.  Nursing notes reviewed. Weight trend  reviewed. Abnormalities and Problem-Specific physical exam findings:  appears to be in opioid(s) withdrawal, discomfort, sniffling, mild dilation pupils.  General Appearance:  No acute distress appreciable.   Well-groomed, healthy-appearing male.  Well proportioned with no abnormal fat distribution.  Good muscle tone. Skin: Clear and well-hydrated. Pulmonary:  Normal work of breathing at rest, no respiratory distress apparent. SpO2: 94 %  Musculoskeletal: All extremities are intact.  Neurological:  Awake, alert, oriented, and engaged.  No obvious focal neurological deficits or cognitive impairments.  Sensorium seems unclouded.   Speech is clear and coherent with logical content. Psychiatric:  Appropriate mood, pleasant and cooperative demeanor,  thoughtful and engaged during the exam  Results Reviewed:    No results found for any visits on 06/25/23.  Lab on 05/23/2023  Component Date Value   Summary 05/23/2023 Note   Admission on 01/06/2023, Discharged on 01/06/2023  Component Date Value   Alcohol, Ethyl (B) 01/06/2023 76 (H)    Opiates 01/06/2023 NONE DETECTED    Cocaine 01/06/2023 NONE DETECTED    Benzodiazepines 01/06/2023 NONE DETECTED    Amphetamines 01/06/2023 POSITIVE (A)    Tetrahydrocannabinol 01/06/2023 POSITIVE (A)    Barbiturates 01/06/2023 NONE DETECTED   Office Visit on 12/06/2022  Component Date Value   Amphetamines 12/06/2022 NEGATIVE    Barbiturates 12/06/2022 NEGATIVE    Benzodiazepines 12/06/2022 NEGATIVE    Cocaine Metabolite 12/06/2022 NEGATIVE    Marijuana Metabolite 12/06/2022 POSITIVE (A)    Methadone Metabolite 12/06/2022 NEGATIVE    Opiates 12/06/2022 NEGATIVE    Oxycodone 12/06/2022 NEGATIVE    Creatinine 12/06/2022 >300.0    pH 12/06/2022 6.1    Oxidant 12/06/2022 NEGATIVE    Notes and Comments 12/06/2022    Office Visit on 10/23/2022  Component Date Value   Sodium 10/23/2022 140    Potassium 10/23/2022 4.0    Chloride 10/23/2022 103    CO2  10/23/2022 29    Glucose, Bld 10/23/2022 98    BUN 10/23/2022 14    Creatinine, Ser 10/23/2022 0.86    Total Bilirubin 10/23/2022 0.5    Alkaline Phosphatase 10/23/2022 113    AST 10/23/2022 22    ALT 10/23/2022 32    Total Protein 10/23/2022 7.6    Albumin 10/23/2022 4.7    GFR 10/23/2022 119.94    Calcium 10/23/2022 9.8    WBC 10/23/2022 6.1    RBC 10/23/2022 5.20    Hemoglobin 10/23/2022 15.9    HCT 10/23/2022 47.3    MCV 10/23/2022 91.0    MCHC 10/23/2022 33.5    RDW 10/23/2022 13.3    Platelets 10/23/2022 240.0    Neutrophils Relative % 10/23/2022 44.5    Lymphocytes Relative 10/23/2022 43.2    Monocytes Relative 10/23/2022 7.9    Eosinophils Relative 10/23/2022 3.7    Basophils Relative 10/23/2022 0.7    Neutro Abs 10/23/2022 2.7    Lymphs Abs 10/23/2022 2.6    Monocytes Absolute 10/23/2022 0.5    Eosinophils Absolute 10/23/2022 0.2    Basophils Absolute 10/23/2022 0.0    Hepatitis C Ab 10/23/2022 NON-REACTIVE    HIV 1&2 Ab, 4th Generati* 10/23/2022 NON-REACTIVE    No image results found.   No results found.     This encounter employed real-time, collaborative documentation. The patient actively reviewed and updated their medical record on a shared screen, ensuring transparency and facilitating joint problem-solving for the problem list, overview, and plan. This approach promotes accurate, informed care. The treatment plan was discussed and reviewed in detail, including medication safety, potential side effects, and all patient questions. We confirmed understanding and comfort with the plan. Follow-up instructions were established, including contacting the office for any concerns, returning if symptoms worsen, persist, or new symptoms develop, and precautions for potential emergency department visits. ----------------------------------------------------- Mitchell Olszewski, MD  06/25/2023 3:11 PM  [facility]:  [phone]

## 2023-07-01 DIAGNOSIS — Z419 Encounter for procedure for purposes other than remedying health state, unspecified: Secondary | ICD-10-CM | POA: Diagnosis not present

## 2023-08-01 DIAGNOSIS — Z419 Encounter for procedure for purposes other than remedying health state, unspecified: Secondary | ICD-10-CM | POA: Diagnosis not present

## 2023-08-05 ENCOUNTER — Ambulatory Visit (INDEPENDENT_AMBULATORY_CARE_PROVIDER_SITE_OTHER): Payer: Medicaid Other | Admitting: Internal Medicine

## 2023-08-05 ENCOUNTER — Encounter: Payer: Self-pay | Admitting: Internal Medicine

## 2023-08-05 VITALS — BP 118/80 | HR 84 | Temp 98.4°F | Ht 69.0 in | Wt 211.0 lb

## 2023-08-05 DIAGNOSIS — F1190 Opioid use, unspecified, uncomplicated: Secondary | ICD-10-CM

## 2023-08-05 DIAGNOSIS — F988 Other specified behavioral and emotional disorders with onset usually occurring in childhood and adolescence: Secondary | ICD-10-CM

## 2023-08-05 MED ORDER — BUPRENORPHINE HCL-NALOXONE HCL 8-2 MG SL SUBL: 2.0000 | SUBLINGUAL_TABLET | Freq: Every day | SUBLINGUAL | 0 refills | Status: DC

## 2023-08-05 MED ORDER — BUPROPION HCL ER (SR) 100 MG PO TB12: 100.0000 mg | ORAL_TABLET | Freq: Every morning | ORAL | 3 refills | Status: DC

## 2023-08-05 NOTE — Assessment & Plan Note (Signed)
Currently not on ADHD medications, we discussed the potential benefits of Wellbutrin for ADHD symptoms and as an adjunct to Suboxone therapy. We will start Wellbutrin, to be taken every morning, and continue monitoring for ADHD symptoms and response to the medication.  I have discontinued schedule 2 amphetamines until he can demonstrate prolonged consistent progress in recovery, but hope to resume it after he does so.

## 2023-08-05 NOTE — Patient Instructions (Addendum)
It was a pleasure seeing you today! Your health and satisfaction are our top priorities.  Mitchell Hew, MD  Your Providers PCP: Lula Olszewski, MD,  [phone]) Referring Provider: Lula Olszewski, MD,  [phone])  VISIT SUMMARY:  During our appointment, we discussed your recent relapse after returning from [place] and your desire to restart Suboxone treatment. We also talked about your interest in a new treatment involving a patch, your supportive network, and your plans for the future. We also discussed your ADHD and the potential benefits of Wellbutrin.  YOUR PLAN:  -OPIOID USE DISORDER: Opioid Use Disorder is a condition where you have a strong desire to use opioids, which leads to issues in your daily life. We will resume your Suboxone treatment, which has been effective for you in the past. This medication helps to reduce your cravings and withdrawal symptoms. We will also perform urine drug screens at each visit to monitor your progress.  -ADHD: ADHD, or Attention Deficit Hyperactivity Disorder, is a condition that affects your ability to focus and control impulsive behaviors. We will start you on Wellbutrin, a medication that can help manage your ADHD symptoms and also support your Suboxone treatment.  INSTRUCTIONS:  Please return in one week for a follow-up and repeat urine drug screen. Continue to stay in touch with your recovery sponsor and engage in therapy. We will also look into recovery resources in [place] for you.   NEXT STEPS: [x]  Early Intervention: Schedule sooner appointment, call our on-call services, or go to emergency room if there is any significant Increase in pain or discomfort New or worsening symptoms Sudden or severe changes in your health [x]  Flexible Follow-Up: We recommend a No follow-ups on file. for optimal routine care. This allows for progress monitoring and treatment adjustments. [x]  Preventive Care: Schedule your annual preventive  care visit! It's typically covered by insurance and helps identify potential health issues early. [x]  Lab & X-ray Appointments: Incomplete tests scheduled today, or call to schedule. X-rays: [facility] (M-F, 8:30am-noon or 1pm-5pm). [x]  Medical Information Release: Sign a release form at front desk to obtain relevant medical information we don't have.  MAKING THE MOST OF OUR FOCUSED 20 MINUTE APPOINTMENTS: [x]   Clearly state your top concerns at the beginning of the visit to focus our discussion [x]   If you anticipate you will need more time, please inform the front desk during scheduling - we can book multiple appointments in the same week. [x]   If you have transportation problems- use our convenient video appointments or ask about transportation support. [x]   We can get down to business faster if you use MyChart to update information before the visit and submit non-urgent questions before your visit. Thank you for taking the time to provide details through MyChart.  Let our nurse know and she can import this information into your encounter documents.  Arrival and Wait Times: [x]   Arriving on time ensures that everyone receives prompt attention. [x]   Early morning (8a) and afternoon (1p) appointments tend to have shortest wait times. [x]   Unfortunately, we cannot delay appointments for late arrivals or hold slots during phone calls.  Getting Answers and Following Up [x]   Simple Questions & Concerns: For quick questions or basic follow-up after your visit, reach [place] at [phone] or MyChart messaging. [x]   Complex Concerns: If your concern is more complex, scheduling an appointment might be best. Discuss this with the staff to find the most suitable option. [x]   Lab &  Imaging Results: We'll contact you directly if results are abnormal or you don't use MyChart. Most normal results will be on MyChart within 2-3 business days, with a review message from Dr. Jon Billings. Haven't heard  back in 2 weeks? Need results sooner? Contact us at [phone]. [x]   Referrals: Our referral coordinator will manage specialist referrals. The specialist's office should contact you within 2 weeks to schedule an appointment. Call us if you haven't heard from them after 2 weeks.  Staying Connected [x]   MyChart: Activate your MyChart for the fastest way to access results and message [place]. See the last page of this paperwork for instructions on how to activate.  Bring to Your Next Appointment [x]   Medications: Please bring all your medication bottles to your next appointment to ensure we have an accurate record of your prescriptions. [x]   Health Diaries: If you're monitoring any health conditions at home, keeping a diary of your readings can be very helpful for discussions at your next appointment.  Billing [x]   X-ray & Lab Orders: These are billed by separate companies. Contact the invoicing company directly for questions or concerns. [x]   Visit Charges: Discuss any billing inquiries with our administrative services team.  Your Satisfaction Matters [x]   Share Your Experience: We strive for your satisfaction! If you have any complaints, or preferably compliments, please let Dr. Jon Billings know directly or contact our Practice Administrators, Edwena Felty or [employer], by asking at the front desk.   Reviewing Your Records [x]   Review this early draft of your clinical encounter notes below and the final encounter summary tomorrow on MyChart after its been completed.  All orders placed so far are visible here:\

## 2023-08-05 NOTE — Assessment & Plan Note (Signed)
After a recent relapse following a period of sobriety while abroad, we discussed the importance of consistent Suboxone use to prevent further relapses and potential overdose. He showed interest in a patch form of naltrexone, which is not widely available or recommended for his situation. We will resume Suboxone 8mg -2mg , two tablets daily, one in the morning and one at night. Urine drug screens will be performed at each visit, and we may extend the prescription duration with consistent negative drug screens.

## 2023-08-05 NOTE — Progress Notes (Signed)
[facility]: [phone]   Routine Medical Office Visit  Patient:  Mitchell Nelson      Age: 28 y.o.       Sex:  male  Date:   08/05/2023 Patient Care Team: Mitchell Olszewski, MD as PCP - General (Internal Medicine) Today's Healthcare Provider: Lula Olszewski, MD   Assessment and Plan:   Mitchell Nelson was seen today for recovery.  Opioid use disorder Overview: June 25, 2023 interim history tried suboxone but made withdrawal worse after 3 days withdrawal with just 0.5 film and he hated the taste.    June 12, 2023 interim history:   yet to begin suboxone.  Still using actively. Know to wait until 48 hour  history of bad experience when detoxing with subs.   05/07/2023 Interim history: He reports he has not had any slips since our last visit and he is working with his sponsor and in the program and is not having problems with triggers peoples places things or any other issues at this time meeting every sunday   Prior history:  Repeatedly  declined  Suboxone or Vivitrol for treatment of his opioid use disorder  He also follows with a sponsor which I's encouraged him to keep doing as well as continue to make use of his outstanding family support.  Use disorder is severe.  IVDU and polysubstance use history   Orders: -     Buprenorphine HCl-Naloxone HCl; Place 2 tablets under the tongue daily for 7 days.  Dispense: 14 tablet; Refill: 0 -     DRUG MONITOR, PANEL 5, SCREEN, URINE -     buPROPion HCl ER (SR); Take 1 tablet (100 mg total) by mouth in the morning.  Dispense: 90 tablet; Refill: 3    General Health Maintenance / Followup Plans He will return in one week for a follow-up and repeat urine drug screen. We encourage him to continue contact with his recovery sponsor and engage in therapy. We will also explore potential recovery resources in [place], as he mentioned.       Recommended follow up: 1 week        Clinical Presentation:    28 y.o.  male who has ADD (attention deficit disorder); Cough variant asthma; Obesity due to energy imbalance; History of substance use disorder; Substance induced mood disorder (HCC); Major depressive disorder with current active episode; h/o IVDU (intravenous drug user); High risk medication use; Opioid use disorder; Wheezing; Tobacco use disorder; Opioid use disorder, severe, dependence (HCC); and Moderate persistent asthma without complication on their problem list. His reasons/main concerns/chief complaints for today's office visit are Recovery (Opioid use.)   AI-Extracted: Discussed the use of AI scribe software for clinical note transcription with the patient, who gave verbal consent to proceed.  History of Present Illness   The patient, with a history of substance use disorder, recently returned from a month-long stay in [place], where he experienced a significant improvement in his mental and physical health. He describes a sense of relaxation and mental clarity during his stay, with a notable absence of substance cravings. However, upon returning to the [place], the patient experienced a relapse a few days later, attributing this to a sense of discomfort and edginess he associates with his local environment.  The patient has been sober for the past six days and has previously used Suboxone to manage withdrawal symptoms, which he reports was effective. He expresses a willingness to restart Suboxone treatment, suggesting a dosage of  one tablet in the morning and one at night, based on his previous experience. He also expresses interest in a potential new treatment involving a patch, which he heard about from his mother, but lacks specific details about this treatment.  The patient has a supportive network, including a sponsor from [place], and is actively engaged in recovery work, including reading recovery and relapse literature and revisiting step one of his recovery program. 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     He  has a past medical history of ADD (attention deficit disorder), Asthma, Does not have health insurance (02/11/2023), History of substance use disorder (10/23/2022), Other insomnia (10/23/2022), Polycythemia (06/01/2019), and Substance induced mood disorder (HCC) (10/23/2022). Current Outpatient Medications on File Prior to Visit  Medication Sig   Albuterol-Budesonide (AIRSUPRA) 90-80 MCG/ACT AERO Inhale 2 puffs into the lungs 4 (four) times daily as needed.   amphetamine-dextroamphetamine (ADDERALL) 20 MG tablet Take 1 tablet (20 mg total) by mouth 2 (two) times daily.   cloNIDine (CATAPRES) 0.1 MG tablet Take 1 tablet (0.1 mg total) by mouth at bedtime.   meclizine (ANTIVERT) 25 MG tablet Take 1 tablet (25 mg total) by mouth 3 (three) times daily as needed for dizziness.   metFORMIN (GLUCOPHAGE) 500 MG tablet Take 1 tablet (500 mg total) by mouth 2 (two) times daily with a meal.   ondansetron (ZOFRAN) 4 MG tablet Take 1 tablet (4 mg total) by mouth every 8 (eight) hours as needed for nausea or vomiting.   predniSONE (DELTASONE) 20 MG tablet Take 2 pills for 3 days, 1 pill for 4 days   No current facility-administered medications on file prior to visit.   Medications Discontinued During This Encounter  Medication Reason   buprenorphine-naloxone (SUBOXONE) 8-2 mg SUBL SL tablet Expired Prescription   traZODone (DESYREL) 100 MG tablet Completed Course   sertraline (ZOLOFT) 100 MG tablet Completed Course   buprenorphine-naloxone (SUBOXONE) 8-2 mg SUBL SL tablet Reorder         Clinical Data Analysis:   Physical Exam  BP 118/80 (BP Location: Left Arm, Patient Position: Sitting)   Pulse 84   Temp 98.4 F (36.9 C) (Temporal)   Ht 5\' 9"  (1.753 m)   Wt 211 lb (95.7 kg)   SpO2 97%   BMI 31.16 kg/m  Wt  Readings from Last 10 Encounters:  08/05/23 211 lb (95.7 kg)  06/25/23 220 lb 9.6 oz (100.1 kg)  06/12/23 216 lb 3.2 oz (98.1 kg)  06/04/23 205 lb 12.8 oz (93.4 kg)  05/07/23 220 lb (99.8 kg)  03/11/23 222 lb 9.6 oz (101 kg)  02/11/23 223 lb 6.4 oz (101.3 kg)  01/09/23 211 lb 9.6 oz (96 kg)  12/06/22 218 lb 12.8 oz (99.2 kg)  11/06/22 227 lb (103 kg)   Vital signs reviewed.  Nursing notes reviewed. Weight trend reviewed. Abnormalities and Problem-Specific physical exam findings:  appears ok.  General Appearance:  No acute distress appreciable.   Well-groomed, healthy-appearing male.  Well proportioned with no abnormal fat distribution.  Good muscle tone. Skin: Clear and well-hydrated. Pulmonary:  Normal work of breathing at rest, no respiratory distress apparent. SpO2: 97 %  Musculoskeletal: All extremities are intact.  Neurological:  Awake, alert, oriented, and engaged.  No obvious focal neurological deficits or cognitive impairments.  Sensorium seems unclouded.   Speech  is clear and coherent with logical content. Psychiatric:  Appropriate mood, pleasant and cooperative demeanor, thoughtful and engaged during the exam  Results Reviewed:     No results found for any visits on 08/05/23.  Lab on 05/23/2023  Component Date Value   Summary 05/23/2023 Note   Admission on 01/06/2023, Discharged on 01/06/2023  Component Date Value   Alcohol, Ethyl (B) 01/06/2023 76 (H)    Opiates 01/06/2023 NONE DETECTED    Cocaine 01/06/2023 NONE DETECTED    Benzodiazepines 01/06/2023 NONE DETECTED    Amphetamines 01/06/2023 POSITIVE (A)    Tetrahydrocannabinol 01/06/2023 POSITIVE (A)    Barbiturates 01/06/2023 NONE DETECTED   Office Visit on 12/06/2022  Component Date Value   Amphetamines 12/06/2022 NEGATIVE    Barbiturates 12/06/2022 NEGATIVE    Benzodiazepines 12/06/2022 NEGATIVE    Cocaine Metabolite 12/06/2022 NEGATIVE    Marijuana Metabolite 12/06/2022 POSITIVE (A)    Methadone Metabolite  12/06/2022 NEGATIVE    Opiates 12/06/2022 NEGATIVE    Oxycodone 12/06/2022 NEGATIVE    Creatinine 12/06/2022 >300.0    pH 12/06/2022 6.1    Oxidant 12/06/2022 NEGATIVE    Notes and Comments 12/06/2022    Office Visit on 10/23/2022  Component Date Value   Sodium 10/23/2022 140    Potassium 10/23/2022 4.0    Chloride 10/23/2022 103    CO2 10/23/2022 29    Glucose, Bld 10/23/2022 98    BUN 10/23/2022 14    Creatinine, Ser 10/23/2022 0.86    Total Bilirubin 10/23/2022 0.5    Alkaline Phosphatase 10/23/2022 113    AST 10/23/2022 22    ALT 10/23/2022 32    Total Protein 10/23/2022 7.6    Albumin 10/23/2022 4.7    GFR 10/23/2022 119.94    Calcium 10/23/2022 9.8    WBC 10/23/2022 6.1    RBC 10/23/2022 5.20    Hemoglobin 10/23/2022 15.9    HCT 10/23/2022 47.3    MCV 10/23/2022 91.0    MCHC 10/23/2022 33.5    RDW 10/23/2022 13.3    Platelets 10/23/2022 240.0    Neutrophils Relative % 10/23/2022 44.5    Lymphocytes Relative 10/23/2022 43.2    Monocytes Relative 10/23/2022 7.9    Eosinophils Relative 10/23/2022 3.7    Basophils Relative 10/23/2022 0.7    Neutro Abs 10/23/2022 2.7    Lymphs Abs 10/23/2022 2.6    Monocytes Absolute 10/23/2022 0.5    Eosinophils Absolute 10/23/2022 0.2    Basophils Absolute 10/23/2022 0.0    Hepatitis C Ab 10/23/2022 NON-REACTIVE    HIV 1&2 Ab, 4th Generati* 10/23/2022 NON-REACTIVE    No image results found.   No results found.  No results found.    This encounter employed real-time, collaborative documentation. The patient actively reviewed and updated their medical record on a shared screen, ensuring transparency and facilitating joint problem-solving for the problem list, overview, and plan. This approach promotes accurate, informed care. The treatment plan was discussed and reviewed in detail, including medication safety, potential side effects, and all patient questions. We confirmed understanding and comfort with the plan. Follow-up  instructions were established, including contacting the office for any concerns, returning if symptoms worsen, persist, or new symptoms develop, and precautions for potential emergency department visits. ----------------------------------------------------- Mitchell Olszewski, MD  08/05/2023 8:53 PM  [facility]:  [phone]

## 2023-08-08 ENCOUNTER — Ambulatory Visit: Payer: Medicaid Other | Admitting: Internal Medicine

## 2023-09-01 DIAGNOSIS — Z419 Encounter for procedure for purposes other than remedying health state, unspecified: Secondary | ICD-10-CM | POA: Diagnosis not present

## 2023-09-26 ENCOUNTER — Encounter: Payer: Self-pay | Admitting: Internal Medicine

## 2023-09-26 ENCOUNTER — Ambulatory Visit: Payer: Medicaid Other | Admitting: Internal Medicine

## 2023-09-26 VITALS — BP 118/86 | HR 99 | Temp 98.6°F | Ht 69.0 in | Wt 220.0 lb

## 2023-09-26 DIAGNOSIS — F988 Other specified behavioral and emotional disorders with onset usually occurring in childhood and adolescence: Secondary | ICD-10-CM

## 2023-09-26 DIAGNOSIS — F17200 Nicotine dependence, unspecified, uncomplicated: Secondary | ICD-10-CM

## 2023-09-26 DIAGNOSIS — F1190 Opioid use, unspecified, uncomplicated: Secondary | ICD-10-CM | POA: Diagnosis not present

## 2023-09-26 MED ORDER — BUPROPION HCL ER (SR) 100 MG PO TB12: 100.0000 mg | ORAL_TABLET | Freq: Every morning | ORAL | 3 refills | Status: DC

## 2023-09-26 NOTE — Patient Instructions (Signed)
VISIT SUMMARY:  During our visit, we discussed your ongoing struggles with substance use disorder and ADHD. You mentioned that you have periods of calm followed by episodes of relapse, which you described as a 'relapsing remitting' pattern. We also talked about your current lifestyle, including your work schedule and your role in assisting your parents.  YOUR PLAN:  -SUBSTANCE USE DISORDER: This is a condition where you have a hard time controlling your use of substances, which can lead to harmful consequences. We discussed the importance of engaging in fulfilling work and activities to help manage this. I recommend starting the medication Wellbutrin, which may help reduce your tendency to relapse and increase your sense of fulfillment. Please continue using the 'I Am Sober' app, stay in touch with your sponsor, and explore fulfilling work or activities.  I also believe Chat[employer] could be a great recovery coach and guide for exploring these activities.  -ADHD: ADHD, or Attention Deficit Hyperactivity Disorder, is a condition that affects your ability to focus and control impulsive behaviors. The medication Wellbutrin can help manage these symptoms. Please pick up and start this medication as prescribed.  -GENERAL HEALTH MAINTENANCE: This involves taking care of your overall health. Continue to use your recovery tools and resources. I also recommend using the ChatGPT app for additional support.  INSTRUCTIONS:  Please start taking the prescribed Wellbutrin medication. Continue using the 'I Am Sober' app and maintaining contact with your sponsor. Also, consider exploring fulfilling work or activities. For additional support, you can use the ChatGPT app.

## 2023-09-26 NOTE — Progress Notes (Signed)
Mitchell Nelson Mitchell Nelson: [phone]   -- Medical Office Visit --  Patient:  Mitchell Nelson      Age: 28 y.o.       Sex:  male  Date:   09/26/2023 Patient Care Team: Mitchell Olszewski, MD as PCP - General (Internal Medicine) Today's Healthcare Provider: Lula Olszewski, MD      Assessment & Plan Opioid use disorder Persistent. Not on medication(s) treatment(s) but I would like to keep him on suboxone Counseled today - He should continue engagement with recovery tools and resources. Using the ChatGPT app for additional support is also recommended.  Tobacco use disorder He exhibits a relapsing and remitting pattern of substance use. We discussed the importance of engaging in fulfilling work and activities. Wellbutrin (Bupropion) may offer benefits in reducing relapse and impulsivity while enhancing fulfillment. He is encouraged to start Wellbutrin as prescribed, continue using the "I Am Sober" app, maintain contact with his sponsor, and explore fulfilling work or activities. Attention deficit disorder (ADD) without hyperactivity The potential benefits of Wellbutrin (Bupropion) in managing impulsivity related to ADHD were discussed. He is encouraged to pick up and start Wellbutrin as prescribed.     ED Discharge Orders          Ordered    buPROPion ER [facility] SR) 100 MG 12 hr tablet  Every morning        09/26/23 1002          Diagnoses and all orders for this visit: Moderate persistent asthma without complication Opioid use disorder -     buPROPion ER (WELLBUTRIN SR) 100 MG 12 hr tablet; Take 1 tablet (100 mg total) by mouth in the morning. Tobacco use disorder  Recommended follow-up: 1 month          Subjective   28 y.o. male who has ADD (attention deficit disorder); Cough variant asthma; Central adiposity; History of substance use disorder; Major depressive disorder with current active episode; h/o IVDU (intravenous drug user); Opioid use  disorder; Tobacco use disorder; and Moderate persistent asthma without complication on their problem list. His reasons/main concerns/chief complaints for today's office visit are Follow-up   ------------------------------------------------------------------------------------------------------------------------ AI-Extracted: Discussed the use of AI scribe software for clinical note transcription with the patient, who gave verbal consent to proceed.  History of Present Illness   The patient, with a history of substance use disorder and ADHD, presents with ongoing struggles in maintaining a stable lifestyle. He reports periods of calm followed by episodes of relapse, describing his pattern as "relapsing remitting." He has been working seven days a week, which he finds distracting rather than fulfilling. He also assists his parents, which provides some degree of fulfillment.  The patient has been prescribed Bupropion (Wellbutrin) for ADHD and to aid in substance use recovery, but he has not yet started this medication.  In addition to medical treatment, the patient is involved in a 12-step program and has a sponsor. He also uses the "I Am Sober" app as a tool in his recovery journey. He has not engaged in any other counseling or recovery programs.      He has a past medical history of ADD (attention deficit disorder), Asthma, Does not have health insurance (02/11/2023), High risk medication use (12/06/2022), History of substance use disorder (10/23/2022), Opioid use disorder, severe, dependence (HCC) (07/01/2020), Other insomnia (10/23/2022), Polycythemia (06/01/2019), Substance induced mood disorder (HCC) (10/23/2022), and Wheezing (07/02/2020).  Problem list overviews that were updated at today's visit: Problem  Central  Adiposity   BMI Readings from Last 5 Encounters:  03/11/23 32.87 kg/m  02/11/23 32.99 kg/m  01/09/23 31.25 kg/m  12/06/22 32.31 kg/m  11/06/22 33.52 kg/m   Wt Readings  from Last 3 Encounters:  03/11/23 222 lb 9.6 oz (101 kg)  02/11/23 223 lb 6.4 oz (101.3 kg)  01/09/23 211 lb 9.6 oz (96 kg)      Major Depressive Disorder With Current Active Episode      09/26/2023    9:40 AM 08/05/2023    3:11 PM 06/04/2023    9:20 AM  Depression screen PHQ 2/9  Decreased Interest 0 1 0  Down, Depressed, Hopeless 1 1 0  PHQ - 2 Score 1 2 0  Altered sleeping 1 0   Tired, decreased energy 2 0   Change in appetite 2 0   Feeling bad or failure about yourself  1 1   Trouble concentrating 0 0   Moving slowly or fidgety/restless 1 0   Suicidal thoughts 0 0   PHQ-9 Score 8 3   Difficult doing work/chores Somewhat difficult Not difficult at all       Tobacco Use Disorder   Bout a ppd declined / deferred / expressed a preference to not move forward with smoking cessation counseling 09/26/2023   Moderate Persistent Asthma Without Complication   No asthma attacks for a while   High Risk Medication Use (Resolved)   Indication for controlled substance: Attention Deficit Hyperactivity Disorder (ADHD)  Medication and dose: see medication list- stimulant # pills per month: 30 Last UDS date: 12/06/22  office and 01/06/23 emergency room visit Controlled substance contract signed (Y/N): Yes, scanned  PDMP last reviewed (include red flags): PDMP reviewed during this encounter.  Diversion Prevention:  warned of risk of discontinuation with legal or substance use issues,  we have signed contract,  plan continued intermittent random drug screens and pill counts.  He will return to clinic more frequently than typical patients due to history of substance use disorder and is expected to stay in remission as a condition of continued medication management for Attention Deficit Hyperactivity Disorder (ADHD).       Substance Induced Mood Disorder (Hcc) (Resolved)   Although he reports sobriety and extensive prior rehab prior to 10/23/22 depression screen  He reported that substances caused  the depression and was very motivated for recovery.    10/23/2022   10:14 AM 09/02/2020    9:34 AM 06/15/2020    2:46 PM  PHQ9 SCORE ONLY  PHQ-9 Total Score 23 0 0      Wheezing (Resolved)   Formatting of this note might be different from the original. Spacer ordered.   Opioid Use Disorder, Severe, Dependence (Hcc) (Resolved)   June 04, 2023 interim history:   reports relapse was severe with binge starting after a single ecstasy.  Felt too comfortable.  Maybe guard was let down.  Has been at home since Friday.  Has been detoxing for about a month and a half.   Recent urine drug screen showed morphine/fentanyl/meth from ecstasy.  Last use 4 days ago. Detoxing at home. Woke up and vomiting this am.  Today and yesterday withdrawal as well.     Current Outpatient Medications on File Prior to Visit  Medication Sig   Albuterol-Budesonide (AIRSUPRA) 90-80 MCG/ACT AERO Inhale 2 puffs into the lungs 4 (four) times daily as needed.   amphetamine-dextroamphetamine (ADDERALL) 20 MG tablet Take 1 tablet (20 mg total) by mouth 2 (two) times daily.  cloNIDine (CATAPRES) 0.1 MG tablet Take 1 tablet (0.1 mg total) by mouth at bedtime.   meclizine (ANTIVERT) 25 MG tablet Take 1 tablet (25 mg total) by mouth 3 (three) times daily as needed for dizziness.   metFORMIN (GLUCOPHAGE) 500 MG tablet Take 1 tablet (500 mg total) by mouth 2 (two) times daily with a meal.   ondansetron (ZOFRAN) 4 MG tablet Take 1 tablet (4 mg total) by mouth every 8 (eight) hours as needed for nausea or vomiting.   predniSONE (DELTASONE) 20 MG tablet Take 2 pills for 3 days, 1 pill for 4 days   buprenorphine-naloxone (SUBOXONE) 8-2 mg SUBL SL tablet Place 2 tablets under the tongue daily for 7 days.   No current facility-administered medications on file prior to visit.   Medications Discontinued During This Encounter  Medication Reason   buPROPion ER (WELLBUTRIN SR) 100 MG 12 hr tablet Reorder     Objective   Physical Exam  BP  118/86 (BP Location: Left Arm, Patient Position: Sitting)   Pulse 99   Temp 98.6 F (37 C) (Temporal)   Ht 5\' 9"  (1.753 m)   Wt 220 lb (99.8 kg)   SpO2 94%   BMI 32.49 kg/m  Wt Readings from Last 10 Encounters:  09/26/23 220 lb (99.8 kg)  08/05/23 211 lb (95.7 kg)  06/25/23 220 lb 9.6 oz (100.1 kg)  06/12/23 216 lb 3.2 oz (98.1 kg)  06/04/23 205 lb 12.8 oz (93.4 kg)  05/07/23 220 lb (99.8 kg)  03/11/23 222 lb 9.6 oz (101 kg)  02/11/23 223 lb 6.4 oz (101.3 kg)  01/09/23 211 lb 9.6 oz (96 kg)  12/06/22 218 lb 12.8 oz (99.2 kg)   Vital signs reviewed.  Nursing notes reviewed. Weight trend reviewed. Abnormalities and Problem-Specific physical exam findings:  looks well today.  General Appearance:  No acute distress appreciable.   Well-groomed, healthy-appearing male.  Well proportioned with no abnormal fat distribution.  Good muscle tone. Pulmonary:  Normal work of breathing at rest, no respiratory distress apparent. SpO2: 94 %  Musculoskeletal: All extremities are intact.  Neurological:  Awake, alert, oriented, and engaged.  No obvious focal neurological deficits or cognitive impairments.  Sensorium seems unclouded.   Speech is clear and coherent with logical content. Psychiatric:  Appropriate mood, pleasant and cooperative demeanor, thoughtful and engaged during the exam  Results            No results found for any visits on 09/26/23.  Office Visit on 08/05/2023  Component Date Value   Amphetamines 08/05/2023 NEGATIVE    Barbiturates 08/05/2023 NEGATIVE    Benzodiazepines 08/05/2023 NEGATIVE    Cocaine Metabolite 08/05/2023 NEGATIVE    Marijuana Metabolite 08/05/2023 POSITIVE (A)    Methadone Metabolite 08/05/2023 NEGATIVE    Opiates 08/05/2023 NEGATIVE    Oxycodone 08/05/2023 NEGATIVE    Creatinine 08/05/2023 113.1    pH 08/05/2023 6.0    Oxidant 08/05/2023 NEGATIVE    Notes and Comments 08/05/2023    Lab on 05/23/2023  Component Date Value   Summary 05/23/2023 Note    Admission on 01/06/2023, Discharged on 01/06/2023  Component Date Value   Alcohol, Ethyl (B) 01/06/2023 76 (H)    Opiates 01/06/2023 NONE DETECTED    Cocaine 01/06/2023 NONE DETECTED    Benzodiazepines 01/06/2023 NONE DETECTED    Amphetamines 01/06/2023 POSITIVE (A)    Tetrahydrocannabinol 01/06/2023 POSITIVE (A)    Barbiturates 01/06/2023 NONE DETECTED   Office Visit on 12/06/2022  Component Date Value  Amphetamines 12/06/2022 NEGATIVE    Barbiturates 12/06/2022 NEGATIVE    Benzodiazepines 12/06/2022 NEGATIVE    Cocaine Metabolite 12/06/2022 NEGATIVE    Marijuana Metabolite 12/06/2022 POSITIVE (A)    Methadone Metabolite 12/06/2022 NEGATIVE    Opiates 12/06/2022 NEGATIVE    Oxycodone 12/06/2022 NEGATIVE    Creatinine 12/06/2022 >300.0    pH 12/06/2022 6.1    Oxidant 12/06/2022 NEGATIVE    Notes and Comments 12/06/2022    Office Visit on 10/23/2022  Component Date Value   Sodium 10/23/2022 140    Potassium 10/23/2022 4.0    Chloride 10/23/2022 103    CO2 10/23/2022 29    Glucose, Bld 10/23/2022 98    BUN 10/23/2022 14    Creatinine, Ser 10/23/2022 0.86    Total Bilirubin 10/23/2022 0.5    Alkaline Phosphatase 10/23/2022 113    AST 10/23/2022 22    ALT 10/23/2022 32    Total Protein 10/23/2022 7.6    Albumin 10/23/2022 4.7    GFR 10/23/2022 119.94    Calcium 10/23/2022 9.8    WBC 10/23/2022 6.1    RBC 10/23/2022 5.20    Hemoglobin 10/23/2022 15.9    HCT 10/23/2022 47.3    MCV 10/23/2022 91.0    MCHC 10/23/2022 33.5    RDW 10/23/2022 13.3    Platelets 10/23/2022 240.0    Neutrophils Relative % 10/23/2022 44.5    Lymphocytes Relative 10/23/2022 43.2    Monocytes Relative 10/23/2022 7.9    Eosinophils Relative 10/23/2022 3.7    Basophils Relative 10/23/2022 0.7    Neutro Abs 10/23/2022 2.7    Lymphs Abs 10/23/2022 2.6    Monocytes Absolute 10/23/2022 0.5    Eosinophils Absolute 10/23/2022 0.2    Basophils Absolute 10/23/2022 0.0    Hepatitis C Ab 10/23/2022  NON-REACTIVE    HIV 1&2 Ab, 4th Generati* 10/23/2022 NON-REACTIVE    No image results found.   No results found.  No results found.     Additional Info: This encounter employed real-time, collaborative documentation. The patient actively reviewed and updated their medical record on a shared screen, ensuring transparency and facilitating joint problem-solving for the problem list, overview, and plan. This approach promotes accurate, informed care. The treatment plan was discussed and reviewed in detail, including medication safety, potential side effects, and all patient questions. We confirmed understanding and comfort with the plan. Follow-up instructions were established, including contacting the office for any concerns, returning if symptoms worsen, persist, or new symptoms develop, and precautions for potential emergency department visits.

## 2023-09-26 NOTE — Assessment & Plan Note (Signed)
He exhibits a relapsing and remitting pattern of substance use. We discussed the importance of engaging in fulfilling work and activities. Wellbutrin (Bupropion) may offer benefits in reducing relapse and impulsivity while enhancing fulfillment. He is encouraged to start Wellbutrin as prescribed, continue using the "I Am Sober" app, maintain contact with his sponsor, and explore fulfilling work or activities.

## 2023-09-26 NOTE — Assessment & Plan Note (Signed)
The potential benefits of Wellbutrin (Bupropion) in managing impulsivity related to ADHD were discussed. He is encouraged to pick up and start Wellbutrin as prescribed.

## 2023-09-26 NOTE — Assessment & Plan Note (Addendum)
Persistent. Not on medication(s) treatment(s) but I would like to keep him on suboxone Counseled today - He should continue engagement with recovery tools and resources. Using the ChatGPT app for additional support is also recommended.

## 2023-10-01 DIAGNOSIS — Z419 Encounter for procedure for purposes other than remedying health state, unspecified: Secondary | ICD-10-CM | POA: Diagnosis not present

## 2023-10-06 ENCOUNTER — Other Ambulatory Visit: Payer: Self-pay | Admitting: Internal Medicine

## 2023-10-06 DIAGNOSIS — R110 Nausea: Secondary | ICD-10-CM

## 2023-11-01 DIAGNOSIS — Z419 Encounter for procedure for purposes other than remedying health state, unspecified: Secondary | ICD-10-CM | POA: Diagnosis not present

## 2023-11-22 ENCOUNTER — Ambulatory Visit (INDEPENDENT_AMBULATORY_CARE_PROVIDER_SITE_OTHER): Payer: Medicaid Other | Admitting: Internal Medicine

## 2023-11-22 VITALS — BP 112/72 | HR 75 | Temp 98.0°F | Ht 69.0 in | Wt 210.6 lb

## 2023-11-22 DIAGNOSIS — F1190 Opioid use, unspecified, uncomplicated: Secondary | ICD-10-CM | POA: Diagnosis not present

## 2023-11-22 DIAGNOSIS — R110 Nausea: Secondary | ICD-10-CM | POA: Diagnosis not present

## 2023-11-22 DIAGNOSIS — G4700 Insomnia, unspecified: Secondary | ICD-10-CM

## 2023-11-22 MED ORDER — TRAZODONE HCL 50 MG PO TABS: 50.0000 mg | ORAL_TABLET | Freq: Every day | ORAL | 1 refills | Status: DC

## 2023-11-22 MED ORDER — BUPRENORPHINE HCL-NALOXONE HCL 8-2 MG SL SUBL: 2.0000 | SUBLINGUAL_TABLET | Freq: Every day | SUBLINGUAL | 0 refills | Status: DC

## 2023-11-22 MED ORDER — ONDANSETRON HCL 4 MG PO TABS: 4.0000 mg | ORAL_TABLET | Freq: Three times a day (TID) | ORAL | 0 refills | Status: DC | PRN

## 2023-11-22 NOTE — Progress Notes (Signed)
[facility]: [phone]   -- Medical Office Visit --  Patient:  Mitchell Nelson      Age: 28 y.o.       Sex:  male  Date:   11/22/2023 Today's Healthcare Provider: Lula Olszewski, MD  ==========================================================================   Assessment & Plan Opioid use disorder Assessment and Plan:  Primary Diagnosis: Severe Opioid Use Disorder (F11.20) Patient demonstrates active engagement in recovery planning with clear insight into illness severity. Has maintained employment and basic functioning despite ongoing use. Family history significant for substance use mortality creates elevated risk profile. Currently stable without acute medical complications.  Plan: 1. Medication Management - Renewed Suboxone tablets 8mg -2mg  #14 with instructions for proper induction - Continued current clonidine for withdrawal symptoms - Prescribed Zofran for nausea management during withdrawal - Added trazodone for sleep support during withdrawal period - Deferred restart of bupropion and sertraline until after withdrawal stabilization  2. Withdrawal Management Strategy - Reviewed Clinical Opioid Withdrawal Scale (COWS) use for timing Suboxone induction - Discussed importance of achieving moderate-severe withdrawal before starting Suboxone - Provided guidance on managing withdrawal symptoms - Patient to initiate withdrawal during planned time off over Thanksgiving break  3. Risk Mitigation - Reviewed signs/symptoms requiring immediate medical attention - Provided information about [facility] as backup for severe withdrawal - Discussed risks of concurrent substance use particularly alcohol/benzodiazepines - Emphasized importance of ongoing monitoring given family history of substance use mortality  4. Long-term Recovery Planning - Discussed potential future consideration of Sublocade injection - Reviewed  availability of TMS therapy for withdrawal management - Emphasized chronic nature of illness requiring ongoing support even after achieving sobriety - Will defer tobacco cessation counseling until after withdrawal stabilization  5. Support Systems - Acknowledged disclosure to mother and family support - Discussed importance of maintaining employment stability - Reviewed risks of holiday travel during early recovery  6. Follow-up - Schedule return visit immediately after Thanksgiving holiday - Available for urgent consultation if withdrawal complications arise - Will require long-term monitoring even after achieving stability  Time spent: 24 minutes, majority in counseling regarding substance use management and risk mitigation.  Medical Decision Making: High complexity based on: - Severe, life-threatening chronic illness with risk of mortality - Extensive data review including medication history and prior treatment attempts - High risk of morbidity/mortality from condition and treatment decisions Nausea  Insomnia, unspecified type      Orders Placed During this Encounter:   ED Discharge Orders          Ordered    ondansetron (ZOFRAN) 4 MG tablet  Every 8 hours PRN        11/22/23 1540    traZODone (DESYREL) 50 MG tablet  Daily at bedtime        11/22/23 1540    buprenorphine-naloxone (SUBOXONE) 8-2 mg SUBL SL tablet  Daily        11/22/23 1540          Diagnoses and all orders for this visit: Insomnia, unspecified type -     traZODone (DESYREL) 50 MG tablet; Take 1 tablet (50 mg total) by mouth at bedtime. Nausea -     ondansetron (ZOFRAN) 4 MG tablet; Take 1 tablet (4 mg total) by mouth every 8 (eight) hours as needed for nausea or vomiting. Opioid use disorder -     buprenorphine-naloxone (SUBOXONE) 8-2 mg SUBL SL tablet; Place 2 tablets under the tongue daily for 7 days.  Recommended follow-up: No follow-ups on  file.No future appointments.Patient Care  Team: Lula Olszewski, MD as PCP - General (Internal Medicine)    SUBJECTIVE: 28 y.o. male who has ADD (attention deficit disorder); Cough variant asthma; Central adiposity; History of substance use disorder; Major depressive disorder with current active episode; h/o IVDU (intravenous drug user); Opioid use disorder; Tobacco use disorder; and Moderate persistent asthma without complication on their problem list.  Main reasons for visit/main concerns/chief complaint: Recovery    AI-Extracted: Discussed the use of AI scribe software for clinical note transcription with the patient, who gave verbal consent to proceed.  History of Present Illness   History of Present Illness:  Patient presents for follow-up and expresses readiness to initiate recovery from opioid use disorder. Reports current life circumstances as "unstable" though has maintained employment as a Administrator, [employer], finding fulfillment in assisting older coworkers. Describes feeling emotionally "empty" but derives satisfaction from helping others.  Has proactively planned leave from work over Thanksgiving to attempt transition to Suboxone maintenance therapy. Prior attempts at home induction resulted in severe withdrawal symptoms due to premature administration. Currently has Suboxone and clonidine on hand but requires additional supportive medications for withdrawal management. Denies current use of cannabis, Kratom, alcohol, or benzodiazepines.  Family dynamics are stable though strained - recently disclosed ongoing substance use to mother. Reports extensive family history of substance use mortality, having lost multiple family members including parents and a sibling to substance use complications. This history serves as both motivation for recovery and source of grief.  Demonstrates good insight into illness chronicity and relapse risks even after achieving sobriety. Though expressing faith in "divine protection," acknowledges the  finite nature of such grace. Has developed specific plans for withdrawal management including time off work and medication preparation. Currently working and maintaining activities of daily living without acute medical issues.       Note that patient  has a past medical history of ADD (attention deficit disorder), Asthma, Does not have health insurance (02/11/2023), High risk medication use (12/06/2022), History of substance use disorder (10/23/2022), Opioid use disorder, severe, dependence (HCC) (07/01/2020), Other insomnia (10/23/2022), Polycythemia (06/01/2019), Substance induced mood disorder (HCC) (10/23/2022), and Wheezing (07/02/2020).  Problem list overviews that were updated at today's visit:No problems updated.  Med reconciliation: Current Outpatient Medications on File Prior to Visit  Medication Sig   Albuterol-Budesonide (AIRSUPRA) 90-80 MCG/ACT AERO Inhale 2 puffs into the lungs 4 (four) times daily as needed.   amphetamine-dextroamphetamine (ADDERALL) 20 MG tablet Take 1 tablet (20 mg total) by mouth 2 (two) times daily.   buPROPion ER (WELLBUTRIN SR) 100 MG 12 hr tablet Take 1 tablet (100 mg total) by mouth in the morning.   cloNIDine (CATAPRES) 0.1 MG tablet Take 1 tablet (0.1 mg total) by mouth at bedtime.   meclizine (ANTIVERT) 25 MG tablet Take 1 tablet (25 mg total) by mouth 3 (three) times daily as needed for dizziness.   metFORMIN (GLUCOPHAGE) 500 MG tablet Take 1 tablet (500 mg total) by mouth 2 (two) times daily with a meal.   predniSONE (DELTASONE) 20 MG tablet Take 2 pills for 3 days, 1 pill for 4 days   No current facility-administered medications on file prior to visit.   Medications Discontinued During This Encounter  Medication Reason   ondansetron (ZOFRAN) 4 MG tablet Reorder   buprenorphine-naloxone (SUBOXONE) 8-2 mg SUBL SL tablet Reorder     Objective   Physical Exam     11/22/2023    3:04 PM 09/26/2023    9:33  AM 08/05/2023    2:58 PM  Vitals with BMI   Height 5\' 9"  5\' 9"  5\' 9"   Weight 210 lbs 10 oz 220 lbs 211 lbs  BMI 31.09 32.47 31.15  Systolic 112 118 347  Diastolic 72 86 80  Pulse 75 99 84   Wt Readings from Last 10 Encounters:  11/22/23 210 lb 9.6 oz (95.5 kg)  09/26/23 220 lb (99.8 kg)  08/05/23 211 lb (95.7 kg)  06/25/23 220 lb 9.6 oz (100.1 kg)  06/12/23 216 lb 3.2 oz (98.1 kg)  06/04/23 205 lb 12.8 oz (93.4 kg)  05/07/23 220 lb (99.8 kg)  03/11/23 222 lb 9.6 oz (101 kg)  02/11/23 223 lb 6.4 oz (101.3 kg)  01/09/23 211 lb 9.6 oz (96 kg)   Vital signs reviewed.  Nursing notes reviewed. Weight trend reviewed. General Appearance:  No acute distress appreciable.   Well-groomed, healthy-appearing male.  Well proportioned with no abnormal fat distribution.  Good muscle tone. Pulmonary:  Normal work of breathing at rest, no respiratory distress apparent. SpO2: 97 %  Musculoskeletal: All extremities are intact.  Neurological:  Awake, alert, oriented, and engaged.  No obvious focal neurological deficits or cognitive impairments.  Sensorium seems unclouded.   Speech is clear and coherent with logical content. Psychiatric:  Appropriate mood, pleasant and cooperative demeanor, thoughtful and engaged during the exam  Results         Last CBC Lab Results  Component Value Date   WBC 6.1 10/23/2022   HGB 15.9 10/23/2022   HCT 47.3 10/23/2022   MCV 91.0 10/23/2022   MCH 30.2 08/08/2022   RDW 13.3 10/23/2022   PLT 240.0 10/23/2022   Last metabolic panel Lab Results  Component Value Date   GLUCOSE 98 10/23/2022   NA 140 10/23/2022   K 4.0 10/23/2022   CL 103 10/23/2022   CO2 29 10/23/2022   BUN 14 10/23/2022   CREATININE 0.86 10/23/2022   GFR 119.94 10/23/2022   CALCIUM 9.8 10/23/2022   PROT 7.6 10/23/2022   ALBUMIN 4.7 10/23/2022   LABGLOB 2.4 04/27/2020   AGRATIO 2.0 04/27/2020   BILITOT 0.5 10/23/2022   ALKPHOS 113 10/23/2022   AST 22 10/23/2022   ALT 32 10/23/2022   ANIONGAP 8 08/08/2022   Last lipids Lab  Results  Component Value Date   CHOL 222 (H) 04/27/2020   HDL 42 04/27/2020   LDLCALC 142 (H) 04/27/2020   TRIG 209 (H) 04/27/2020   CHOLHDL 5.3 (H) 04/27/2020   Last hemoglobin A1c No results found for: "HGBA1C" Last thyroid functions Lab Results  Component Value Date   TSH 1.180 04/27/2020   Last vitamin D No results found for: "25OHVITD2", "25OHVITD3", "VD25OH" Last vitamin B12 and Folate No results found for: "VITAMINB12", "FOLATE"     No results found for any visits on 11/22/23.  Office Visit on 08/05/2023  Component Date Value   Amphetamines 08/05/2023 NEGATIVE    Barbiturates 08/05/2023 NEGATIVE    Benzodiazepines 08/05/2023 NEGATIVE    Cocaine Metabolite 08/05/2023 NEGATIVE    Marijuana Metabolite 08/05/2023 POSITIVE (A)    Methadone Metabolite 08/05/2023 NEGATIVE    Opiates 08/05/2023 NEGATIVE    Oxycodone 08/05/2023 NEGATIVE    Creatinine 08/05/2023 113.1    pH 08/05/2023 6.0    Oxidant 08/05/2023 NEGATIVE    Notes and Comments 08/05/2023    Lab on 05/23/2023  Component Date Value   Summary 05/23/2023 Note   Admission on 01/06/2023, Discharged on 01/06/2023  Component Date Value  Alcohol, Ethyl (B) 01/06/2023 76 (H)    Opiates 01/06/2023 NONE DETECTED    Cocaine 01/06/2023 NONE DETECTED    Benzodiazepines 01/06/2023 NONE DETECTED    Amphetamines 01/06/2023 POSITIVE (A)    Tetrahydrocannabinol 01/06/2023 POSITIVE (A)    Barbiturates 01/06/2023 NONE DETECTED   Office Visit on 12/06/2022  Component Date Value   Amphetamines 12/06/2022 NEGATIVE    Barbiturates 12/06/2022 NEGATIVE    Benzodiazepines 12/06/2022 NEGATIVE    Cocaine Metabolite 12/06/2022 NEGATIVE    Marijuana Metabolite 12/06/2022 POSITIVE (A)    Methadone Metabolite 12/06/2022 NEGATIVE    Opiates 12/06/2022 NEGATIVE    Oxycodone 12/06/2022 NEGATIVE    Creatinine 12/06/2022 >300.0    pH 12/06/2022 6.1    Oxidant 12/06/2022 NEGATIVE    Notes and Comments 12/06/2022     No image  results found.   No results found.  No results found.       Additional Info: This encounter employed real-time, collaborative documentation. The patient actively reviewed and updated their medical record on a shared screen, ensuring transparency and facilitating joint problem-solving for the problem list, overview, and plan. This approach promotes accurate, informed care. The treatment plan was discussed and reviewed in detail, including medication safety, potential side effects, and all patient questions. We confirmed understanding and comfort with the plan. Follow-up instructions were established, including contacting the office for any concerns, returning if symptoms worsen, persist, or new symptoms develop, and precautions for potential emergency department visits.  Medical Decision Making Documentation  Level of MDM: Moderate Complexity  1. Number and Complexity of Problems Addressed: - Chronic opioid use disorder (F11.20) with exacerbation - Patient actively using with planned withdrawal attempt - Depression symptoms requiring medication management considerations - Complicating factor of maintaining employment during recovery planning  2. Amount/Complexity of Data Reviewed and Analyzed: - Review of current medication regimen including Suboxone, clonidine - Assessment of previous withdrawal experience to guide current plan - Review of prescribed medications and refill status - Evaluation of readiness for outpatient withdrawal management  3. Risk of Complications and/or Morbidity/Mortality: Moderate risk based on: - Active substance use requiring medication management - Prescription drug management including controlled substances - Need for medication coordination during withdrawal - Multiple social factors affecting treatment planning - Family history increasing risk profile  Clinical Decision Making: - Modified medication regimen for withdrawal management - Prescribed  additional supportive medications - Developed outpatient withdrawal plan - Provided guidance for safe Suboxone induction - Created contingency planning for withdrawal complications - Coordinated timing with work schedule  Supporting G2211: Provider serves as continuing focal point for ongoing substance use disorder management, providing longitudinal care for serious condition requiring medication management and monitoring.

## 2023-11-22 NOTE — Patient Instructions (Signed)
After Visit Summary  Your Health Status Today: You are preparing to begin recovery from opioid use with a planned withdrawal management strategy over Thanksgiving break. We have provided medications to help manage withdrawal symptoms safely.  Medications: 1. Suboxone tablets    - Do NOT take until you are in moderate to severe withdrawal    - Use the COWS scale to help determine when to start    - Take as previously instructed  2. Supporting Medications:    - Clonidine: Continue as prescribed for withdrawal symptoms    - Zofran: Take for nausea as needed    - Trazodone: Use at bedtime for sleep    - Wait to restart other medications until after withdrawal period  Important Instructions: - Wait until you feel very sick (moderate-severe withdrawal) before starting Suboxone - Taking Suboxone too early can cause severe withdrawal symptoms - Do not use alcohol or benzodiazepines during withdrawal - Stay hydrated and try to maintain nutrition even if nauseated  Warning Signs - Seek Immediate Medical Care If: - Severe vomiting or inability to keep fluids down - Chest pain or difficulty breathing - Severe anxiety or panic - Thoughts of self-harm - Seizures  Resources Available: [facility] - Location: On [address] - Available 24/7 for emergency support - Offers immediate care if withdrawal becomes severe  Follow-Up Care: - Schedule appointment after Thanksgiving holiday - Contact office if you need support before then  Remember: - Recovery is possible but requires ongoing support - You have shown strength by choosing to seek help - This is a chronic illness requiring long-term management - Your family history makes careful monitoring especially important  Your Next Appointment: Please schedule follow-up visit for after Thanksgiving holiday  Contact Information: Call 911 or go to nearest emergency room for severe symptoms Contact office  during business hours for other concerns

## 2023-11-22 NOTE — Assessment & Plan Note (Signed)
Assessment and Plan:  Primary Diagnosis: Severe Opioid Use Disorder (F11.20) Patient demonstrates active engagement in recovery planning with clear insight into illness severity. Has maintained employment and basic functioning despite ongoing use. Family history significant for substance use mortality creates elevated risk profile. Currently stable without acute medical complications.  Plan: 1. Medication Management - Renewed Suboxone tablets 8mg -2mg  #14 with instructions for proper induction - Continued current clonidine for withdrawal symptoms - Prescribed Zofran for nausea management during withdrawal - Added trazodone for sleep support during withdrawal period - Deferred restart of bupropion and sertraline until after withdrawal stabilization  2. Withdrawal Management Strategy - Reviewed Clinical Opioid Withdrawal Scale (COWS) use for timing Suboxone induction - Discussed importance of achieving moderate-severe withdrawal before starting Suboxone - Provided guidance on managing withdrawal symptoms - Patient to initiate withdrawal during planned time off over Thanksgiving break  3. Risk Mitigation - Reviewed signs/symptoms requiring immediate medical attention - Provided information about [facility] as backup for severe withdrawal - Discussed risks of concurrent substance use particularly alcohol/benzodiazepines - Emphasized importance of ongoing monitoring given family history of substance use mortality  4. Long-term Recovery Planning - Discussed potential future consideration of Sublocade injection - Reviewed availability of TMS therapy for withdrawal management - Emphasized chronic nature of illness requiring ongoing support even after achieving sobriety - Will defer tobacco cessation counseling until after withdrawal stabilization  5. Support Systems - Acknowledged disclosure to mother and family support - Discussed importance of maintaining  employment stability - Reviewed risks of holiday travel during early recovery  6. Follow-up - Schedule return visit immediately after Thanksgiving holiday - Available for urgent consultation if withdrawal complications arise - Will require long-term monitoring even after achieving stability  Time spent: 24 minutes, majority in counseling regarding substance use management and risk mitigation.  Medical Decision Making: High complexity based on: - Severe, life-threatening chronic illness with risk of mortality - Extensive data review including medication history and prior treatment attempts - High risk of morbidity/mortality from condition and treatment decisions

## 2023-12-01 DIAGNOSIS — Z419 Encounter for procedure for purposes other than remedying health state, unspecified: Secondary | ICD-10-CM | POA: Diagnosis not present

## 2023-12-30 ENCOUNTER — Ambulatory Visit (INDEPENDENT_AMBULATORY_CARE_PROVIDER_SITE_OTHER): Payer: Medicaid Other | Admitting: Internal Medicine

## 2023-12-30 VITALS — BP 122/88 | HR 78 | Temp 97.4°F | Ht 69.0 in | Wt 206.0 lb

## 2023-12-30 DIAGNOSIS — J45909 Unspecified asthma, uncomplicated: Secondary | ICD-10-CM | POA: Diagnosis not present

## 2023-12-30 DIAGNOSIS — F329 Major depressive disorder, single episode, unspecified: Secondary | ICD-10-CM | POA: Diagnosis not present

## 2023-12-30 DIAGNOSIS — F1190 Opioid use, unspecified, uncomplicated: Secondary | ICD-10-CM | POA: Diagnosis not present

## 2023-12-30 MED ORDER — BUPRENORPHINE HCL-NALOXONE HCL 8-2 MG SL SUBL: 2.0000 | SUBLINGUAL_TABLET | Freq: Every day | SUBLINGUAL | 0 refills | Status: DC

## 2023-12-30 NOTE — Patient Instructions (Signed)
VISIT SUMMARY:  During today's visit, we discussed your recent detoxification from opioids and your commitment to long-term Suboxone therapy. We also reviewed your history of depression and attention deficit disorder (ADD), and your current management strategies for these conditions. Additionally, we talked about your recent use of Kratom and its effects, as well as your asthma management. We emphasized the importance of maintaining general health and avoiding triggers during the holidays.  YOUR PLAN:  -OPIOID USE DISORDER: Opioid use disorder is a condition characterized by the compulsive use of opioids despite harmful consequences. You have started Suboxone therapy to manage this condition, taking 1.5 to 2 tablets of 8 mg Suboxone daily. We discussed the risks of overdose, especially with substances like fentanyl, and the importance of avoiding triggers. You are encouraged to use the [employer] app, plan New Year's events with an early bedtime, and avoid people, places, and things associated with drug use. A drug screening was performed today, and you are asked to send pictures of your Suboxone pills to show adherence. We will continue with Suboxone 8 mg tablets, two tablets daily for 14 days, and have scheduled a follow-up in 14 days.  -SUBSTANCE USE DISORDER (KRATOM): Substance use disorder involving Kratom can lead to severe stimulant effects and hallucinations, especially at high doses. You experienced these effects during your recent detox attempt. We advised against using Kratom for detox and encouraged you to continue with Suboxone for maintenance.  -DEPRESSION: Depression is a mood disorder that causes persistent feelings of sadness and loss of interest. Your depression is well-managed with bupropion, and you are eating and sleeping well without overthinking. You have four refills available for bupropion, which you should continue as prescribed. We will monitor for any changes in your depressive  symptoms.  -ASTHMA: Asthma is a condition in which your airways narrow and swell, producing extra mucus. Your asthma is currently managed with an inhaler, which you should continue using as needed.  -GENERAL HEALTH MAINTENANCE: Maintaining general health is crucial, especially during the holidays. We recommend staying sober, avoiding triggers, and following a healthy lifestyle, including a proper diet and sleep. Plan your New Year's events to avoid relapse.  INSTRUCTIONS:  A follow-up appointment has been scheduled in 14 days to continue monitoring and support.

## 2023-12-30 NOTE — Assessment & Plan Note (Signed)
Opioid Use Disorder   Following a recent detoxification, he has initiated Suboxone (buprenorphine/naloxone) maintenance for opioid use disorder, having been clean for four years before an overdose on January 7th last year. He experienced severe withdrawal symptoms, including hallucinations, during the recent detox without Suboxone (probably due to kratom stimulation insomnia) but is committed to long-term maintenance. We discussed the overdose risks, particularly with fentanyl or carfentanil, and his preference for Suboxone tablets over films due to nausea and vomiting. He is currently on 1.5 to 2 tablets of 8 mg Suboxone daily. We also touched on the potential for brain surgery as a last resort for refractory cases... but obviously we are hoping to stay sober and in recovery on suboxone maintenance. We will prescribe Suboxone 8 mg tablets, two tablets daily for 14 days, and schedule a 14-day follow-up. Encouragement was given to use the [employer], plan New Year's events with an early bedtime, and avoid people, places, and things associated with drug use. We discussed the potential benefits of bupropion for reducing stimulant cravings, performed a drug screening today, and requested pictures of Suboxone pills to show adherence.  Substance Use Disorder (Kratom)   He attempted to use Kratom for detox, which led to severe stimulant effects and hallucinations. Given Kratom's weak opioid properties and stimulant effects at high doses, we advised against its use for detox and encouraged the continued use of Suboxone for maintenance.  PDMP reviewed during this encounter.  He has started suboxone a few times this year under encouragement from me but never stuck with and has struggled to get any sustained sobriety.  I gave 2 week supply this time to try to get him in mindset of maintenance for his chronic severe opioid use disorder and to hopefully minimize risk of another new years overdose.

## 2023-12-30 NOTE — Progress Notes (Signed)
==============================  [facility]: [phone]   -- Medical Office Visit --  Patient: Mitchell Nelson      Age: 28 y.o.       Sex:  male  Date:   12/30/2023 Today's Healthcare Provider: Lula Olszewski, MD  ==============================   CHIEF COMPLAINT: 2 month recovery follow-up   SUBJECTIVE: 28 y.o. male who has ADD (attention deficit disorder); Cough variant asthma; Central adiposity; History of substance use disorder; Major depressive disorder with current active episode; h/o IVDU (intravenous drug user); Opioid use disorder; Tobacco use disorder; and Moderate persistent asthma without complication on their problem list.  History of Present Illness The patient, with a history of substance use disorder, presents with a recent history of detoxification from opioids. He reports having undergone a self-managed detoxification process, abstaining from Suboxone for the first three days to allow for complete withdrawal before initiating Suboxone therapy. He describes experiencing severe withdrawal symptoms during this period, including hallucinations and insomnia, which he attributes to the use of Kratom as a detoxification aid.  The patient reports feeling significantly better after initiating Suboxone therapy, with a reduction in withdrawal symptoms. He expresses a commitment to long-term Suboxone therapy as a means of managing his substance use disorder. He also mentions a recent episode of cannabis use, but denies any use of methamphetamines or other stimulants.  In addition to his substance use disorder, the patient also has a history of depression and attention deficit disorder (ADD). He reports good control of his depressive symptoms with bupropion, and expresses a desire to eventually resume treatment for ADD. He acknowledges the need for consistent negative drug screens and adherence to Suboxone therapy to facilitate  this.  The patient also mentions having a support system in place, including a cousin who has successfully managed their own substance use disorder with Suboxone. He expresses a desire to make positive changes in his life, including forming meaningful relationships and taking on responsibilities. He also mentions employment as a factor in his recovery process.  Note that patient  has a past medical history of ADD (attention deficit disorder), Asthma, Does not have health insurance (02/11/2023), High risk medication use (12/06/2022), History of substance use disorder (10/23/2022), Opioid use disorder, severe, dependence (HCC) (07/01/2020), Other insomnia (10/23/2022), Polycythemia (06/01/2019), Substance induced mood disorder (HCC) (10/23/2022), and Wheezing (07/02/2020).  Past Medical History - Opioid overdose - Hallucinations during withdrawal and insomnia with kratom - Insomnia from  - Depression - Attention deficit disorder  Problem list overviews that were updated at today's visit: Problem  Major Depressive Disorder With Current Active Episode      09/26/2023    9:40 AM 08/05/2023    3:11 PM 06/04/2023    9:20 AM  Depression screen PHQ 2/9  Decreased Interest 0 1 0  Down, Depressed, Hopeless 1 1 0  PHQ - 2 Score 1 2 0  Altered sleeping 1 0   Tired, decreased energy 2 0   Change in appetite 2 0   Feeling bad or failure about yourself  1 1   Trouble concentrating 0 0   Moving slowly or fidgety/restless 1 0   Suicidal thoughts 0 0   PHQ-9 Score 8 3   Difficult doing work/chores Somewhat difficult Not difficult at all       He reports this is much better at present.  Medications - Suboxone - Bupropion  Med reconciliation: Current Outpatient Medications on File Prior to Visit  Medication Sig  Albuterol-Budesonide (AIRSUPRA) 90-80 MCG/ACT AERO Inhale 2 puffs into the lungs 4 (four) times daily as needed.   amphetamine-dextroamphetamine (ADDERALL) 20 MG tablet Take 1 tablet  (20 mg total) by mouth 2 (two) times daily.   buPROPion ER (WELLBUTRIN SR) 100 MG 12 hr tablet Take 1 tablet (100 mg total) by mouth in the morning.   cloNIDine (CATAPRES) 0.1 MG tablet Take 1 tablet (0.1 mg total) by mouth at bedtime.   meclizine (ANTIVERT) 25 MG tablet Take 1 tablet (25 mg total) by mouth 3 (three) times daily as needed for dizziness.   metFORMIN (GLUCOPHAGE) 500 MG tablet Take 1 tablet (500 mg total) by mouth 2 (two) times daily with a meal.   ondansetron (ZOFRAN) 4 MG tablet Take 1 tablet (4 mg total) by mouth every 8 (eight) hours as needed for nausea or vomiting.   predniSONE (DELTASONE) 20 MG tablet Take 2 pills for 3 days, 1 pill for 4 days   traZODone (DESYREL) 50 MG tablet Take 1 tablet (50 mg total) by mouth at bedtime.   buprenorphine-naloxone (SUBOXONE) 8-2 mg SUBL SL tablet Place 2 tablets under the tongue daily for 7 days.   No current facility-administered medications on file prior to visit.  There are no discontinued medications.    Objective   Physical Exam     12/30/2023    2:06 PM 12/30/2023    1:56 PM 11/22/2023    3:04 PM  Vitals with BMI  Height  5\' 9"  5\' 9"   Weight  206 lbs 210 lbs 10 oz  BMI  30.41 31.09  Systolic 122 132 960  Diastolic 88 100 72  Pulse  78 75   Wt Readings from Last 10 Encounters:  12/30/23 206 lb (93.4 kg)  11/22/23 210 lb 9.6 oz (95.5 kg)  09/26/23 220 lb (99.8 kg)  08/05/23 211 lb (95.7 kg)  06/25/23 220 lb 9.6 oz (100.1 kg)  06/12/23 216 lb 3.2 oz (98.1 kg)  06/04/23 205 lb 12.8 oz (93.4 kg)  05/07/23 220 lb (99.8 kg)  03/11/23 222 lb 9.6 oz (101 kg)  02/11/23 223 lb 6.4 oz (101.3 kg)   Vital signs reviewed.  Nursing notes reviewed. Weight trend reviewed. Abnormalities and Problem-Specific physical exam findings:  appears well  General Appearance:  No acute distress appreciable.   Well-groomed, healthy-appearing male.  Well proportioned with no abnormal fat distribution.  Good muscle tone. Pulmonary:  Normal  work of breathing at rest, no respiratory distress apparent. SpO2: 98 %  Musculoskeletal: All extremities are intact.  Neurological:  Awake, alert, oriented, and engaged.  No obvious focal neurological deficits or cognitive impairments.  Sensorium seems unclouded.   Speech is clear and coherent with logical content. Psychiatric:  Appropriate mood, pleasant and cooperative demeanor, thoughtful and engaged during the exam    No results found for any visits on 12/30/23. Office Visit on 08/05/2023  Component Date Value   Amphetamines 08/05/2023 NEGATIVE    Barbiturates 08/05/2023 NEGATIVE    Benzodiazepines 08/05/2023 NEGATIVE    Cocaine Metabolite 08/05/2023 NEGATIVE    Marijuana Metabolite 08/05/2023 POSITIVE (A)    Methadone Metabolite 08/05/2023 NEGATIVE    Opiates 08/05/2023 NEGATIVE    Oxycodone 08/05/2023 NEGATIVE    Creatinine 08/05/2023 113.1    pH 08/05/2023 6.0    Oxidant 08/05/2023 NEGATIVE    Notes and Comments 08/05/2023    Lab on 05/23/2023  Component Date Value   Summary 05/23/2023 Note   Admission on 01/06/2023, Discharged on 01/06/2023  Component  Date Value   Alcohol, Ethyl (B) 01/06/2023 76 (H)    Opiates 01/06/2023 NONE DETECTED    Cocaine 01/06/2023 NONE DETECTED    Benzodiazepines 01/06/2023 NONE DETECTED    Amphetamines 01/06/2023 POSITIVE (A)    Tetrahydrocannabinol 01/06/2023 POSITIVE (A)    Barbiturates 01/06/2023 NONE DETECTED   No image results found. No results found.No results found.    Assessment & Plan Opioid use disorder Opioid Use Disorder   Following a recent detoxification, he has initiated Suboxone (buprenorphine/naloxone) maintenance for opioid use disorder, having been clean for four years before an overdose on January 7th last year. He experienced severe withdrawal symptoms, including hallucinations, during the recent detox without Suboxone (probably due to kratom stimulation insomnia) but is committed to long-term maintenance. We discussed  the overdose risks, particularly with fentanyl or carfentanil, and his preference for Suboxone tablets over films due to nausea and vomiting. He is currently on 1.5 to 2 tablets of 8 mg Suboxone daily. We also touched on the potential for brain surgery as a last resort for refractory cases... but obviously we are hoping to stay sober and in recovery on suboxone maintenance. We will prescribe Suboxone 8 mg tablets, two tablets daily for 14 days, and schedule a 14-day follow-up. Encouragement was given to use the [employer], plan New Year's events with an early bedtime, and avoid people, places, and things associated with drug use. We discussed the potential benefits of bupropion for reducing stimulant cravings, performed a drug screening today, and requested pictures of Suboxone pills to show adherence.  Substance Use Disorder (Kratom)   He attempted to use Kratom for detox, which led to severe stimulant effects and hallucinations. Given Kratom's weak opioid properties and stimulant effects at high doses, we advised against its use for detox and encouraged the continued use of Suboxone for maintenance.  PDMP reviewed during this encounter.  He has started suboxone a few times this year under encouragement from me but never stuck with and has struggled to get any sustained sobriety.  I gave 2 week supply this time to try to get him in mindset of maintenance for his chronic severe opioid use disorder and to hopefully minimize risk of another new years overdose. Major depressive disorder with current active episode, unspecified depression episode severity, unspecified whether recurrent Depression   His depression is well-managed with bupropion, under which he is eating and sleeping well without overthinking. He has four refills available for bupropion, which we will continue as prescribed and monitor for any changes in depressive symptoms. Uncomplicated asthma, unspecified asthma severity, unspecified  whether persistent Asthma   His asthma is currently managed with an available refill for his inhaler, which he should continue using as needed.     Orders Placed During this Encounter:   Orders Placed This Encounter  Procedures   DRUG MONITORING, PANEL 8 WITH CONFIRMATION, URINE   Meds ordered this encounter  Medications   buprenorphine-naloxone (SUBOXONE) 8-2 mg SUBL SL tablet    Sig: Place 2 tablets under the tongue daily.    Dispense:  28 tablet    Refill:  0    General Health Maintenance   We discussed the importance of maintaining general health during the holidays, emphasizing the need to stay sober and avoid triggers. Recommendations were made for a healthy lifestyle, including proper diet and sleep, and planning New Year's events to avoid relapse.  Follow-up   A 14-day follow-up appointment has been scheduled to continue monitoring and support.  This document was synthesized by artificial intelligence (Abridge) using HIPAA-compliant recording of the clinical interaction;   We discussed the use of AI scribe software for clinical note transcription with the patient, who gave verbal consent to proceed.    Additional Info: This encounter employed state-of-the-art, real-time, collaborative documentation. The patient actively reviewed and assisted in updating their electronic medical record on a shared screen, ensuring transparency and facilitating joint problem-solving for the problem list, overview, and plan. This approach promotes accurate, informed care. The treatment plan was discussed and reviewed in detail, including medication safety, potential side effects, and all patient questions. We confirmed understanding and comfort with the plan. Follow-up instructions were established, including contacting the office for any concerns, returning if symptoms worsen, persist, or new symptoms develop, and precautions for potential emergency department visits.

## 2023-12-30 NOTE — Assessment & Plan Note (Signed)
Depression   His depression is well-managed with bupropion, under which he is eating and sleeping well without overthinking. He has four refills available for bupropion, which we will continue as prescribed and monitor for any changes in depressive symptoms.

## 2024-01-01 DIAGNOSIS — Z419 Encounter for procedure for purposes other than remedying health state, unspecified: Secondary | ICD-10-CM | POA: Diagnosis not present

## 2024-01-02 LAB — DRUG MONITORING, PANEL 8 WITH CONFIRMATION, URINE
6 Acetylmorphine: NEGATIVE ng/mL (ref <10)
Alcohol Metabolites: NEGATIVE ng/mL (ref <500)
Amphetamines: NEGATIVE ng/mL (ref <500)
Benzodiazepines: NEGATIVE ng/mL (ref <100)
Benzoylecgonine: 1023 ng/mL — ABNORMAL HIGH (ref <100)
Buprenorphine, Urine: POSITIVE ng/mL — AB (ref <5)
Buprenorphine: 111 ng/mL — ABNORMAL HIGH (ref <2)
Cocaine Metabolite: POSITIVE ng/mL — AB (ref <150)
Creatinine: 109.9 mg/dL (ref >=20.0)
MDMA: NEGATIVE ng/mL (ref <500)
Marijuana Metabolite: 267 ng/mL — ABNORMAL HIGH (ref <5)
Marijuana Metabolite: POSITIVE ng/mL — AB (ref <20)
Naloxone: >2000 ng/mL — ABNORMAL HIGH (ref <2)
Norbuprenorphine: 656 ng/mL — ABNORMAL HIGH (ref <2)
Opiates: NEGATIVE ng/mL (ref <100)
Oxidant: NEGATIVE ug/mL (ref <200)
Oxycodone: NEGATIVE ng/mL (ref <100)
pH: 7.5 (ref 4.5–9.0)

## 2024-01-02 LAB — DM TEMPLATE

## 2024-01-08 ENCOUNTER — Ambulatory Visit: Payer: Medicaid Other | Admitting: Internal Medicine

## 2024-01-13 ENCOUNTER — Ambulatory Visit: Payer: Medicaid Other | Admitting: Internal Medicine

## 2024-01-15 ENCOUNTER — Ambulatory Visit: Payer: Medicaid Other | Admitting: Internal Medicine

## 2024-01-15 ENCOUNTER — Encounter: Payer: Self-pay | Admitting: Internal Medicine

## 2024-01-15 DIAGNOSIS — G4700 Insomnia, unspecified: Secondary | ICD-10-CM | POA: Diagnosis not present

## 2024-01-15 DIAGNOSIS — F1190 Opioid use, unspecified, uncomplicated: Secondary | ICD-10-CM

## 2024-01-15 DIAGNOSIS — J45991 Cough variant asthma: Secondary | ICD-10-CM

## 2024-01-15 MED ORDER — BUPRENORPHINE HCL-NALOXONE HCL 8-2 MG SL SUBL: 2.0000 | SUBLINGUAL_TABLET | Freq: Every day | SUBLINGUAL | 0 refills | Status: DC

## 2024-01-15 MED ORDER — TRAZODONE HCL 50 MG PO TABS: 50.0000 mg | ORAL_TABLET | Freq: Every day | ORAL | 1 refills | Status: DC

## 2024-01-16 NOTE — Progress Notes (Signed)
==============================  [facility]: [phone]   -- Medical Office Visit --  Patient: Mitchell Nelson      Age: 29 y.o.       Sex:  male  Date:   01/15/2024 Today's Healthcare Provider: Lula Olszewski, MD  ==============================   CHIEF COMPLAINT: Recovery    SUBJECTIVE: 29 y.o. male who has ADD (attention deficit disorder); Cough variant asthma; Central adiposity; History of substance use disorder; Major depressive disorder with current active episode; h/o IVDU (intravenous drug user); Opioid use disorder; Tobacco use disorder; and Moderate persistent asthma without complication on their problem list.  History of Present Illness The patient, a young adult with a history of substance use disorder, presented for a follow-up consultation. They reported being in recovery and recently starting school for civil engineering, indicating a positive shift towards future-oriented thinking and a commitment to personal development. The patient acknowledged the importance of maintaining sobriety during their academic journey, recognizing the potential for substance use to negatively impact their academic performance.  The patient reported consistent use of Suboxone, a medication-assisted treatment for opioid use disorder, taking two tablets daily. They expressed understanding of the medication's role in preventing withdrawal symptoms and maintaining a stable state, which is crucial for their academic success. The patient also reported using Trazodone, a medication often used to manage insomnia and anxiety, further indicating their commitment to managing their mental health.  The patient disclosed a history of smoking and expressed a desire to quit, indicating an ongoing use of Wellbutrin, a medication often used for smoking cessation. They also reported having a prescription for Albuterol, suggesting a history of respiratory issues,  possibly asthma.  Despite their progress, the patient acknowledged the challenges of maintaining sobriety, particularly in the context of social pressures. They reported having cut ties with friends who were a negative influence on their recovery journey. The patient also expressed interest in mindfulness techniques and journaling as coping strategies, indicating a willingness to engage in self-reflective practices and non-pharmacological interventions to support their recovery.  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 They are actively working towards maintaining sobriety and improving their mental health, demonstrating a commitment to personal growth and future-oriented thinking. The patient acknowledges the challenges of their recovery journey but shows a willingness to engage in various strategies to support their sobriety and overall well-being.  Note that patient  has a past medical history of ADD (attention deficit disorder), Asthma, Does not have health insurance (02/11/2023), High risk medication use (12/06/2022), History of substance use disorder (10/23/2022), Opioid use disorder, severe, dependence (HCC) (07/01/2020), Other insomnia (10/23/2022), Polycythemia (06/01/2019), Substance induced mood disorder (HCC) (10/23/2022), and Wheezing (07/02/2020). Medications - Suboxone (two tablets daily) - Trazodone - Wellbutrin - Albuterol   Problem list overviews that were updated at today's visit:No problems updated.  Med reconciliation: Current Outpatient Medications on File Prior to Visit  Medication Sig   buPROPion ER (WELLBUTRIN SR) 100 MG 12 hr tablet Take 1 tablet (100 mg total) by mouth in the morning.   meclizine (ANTIVERT) 25 MG tablet Take 1 tablet (25 mg total) by mouth 3 (three) times daily as needed for dizziness.   ondansetron (ZOFRAN) 4 MG tablet  Take 1 tablet (4 mg total) by mouth every 8 (eight) hours as needed for nausea or vomiting.   buprenorphine-naloxone (SUBOXONE) 8-2 mg SUBL SL  tablet Place 2 tablets under the tongue daily for 7 days.   No current facility-administered medications on file prior to visit.   Medications Discontinued During This Encounter  Medication Reason   metFORMIN (GLUCOPHAGE) 500 MG tablet    amphetamine-dextroamphetamine (ADDERALL) 20 MG tablet Completed Course   cloNIDine (CATAPRES) 0.1 MG tablet Completed Course   predniSONE (DELTASONE) 20 MG tablet    Albuterol-Budesonide (AIRSUPRA) 90-80 MCG/ACT AERO    traZODone (DESYREL) 50 MG tablet Reorder   buprenorphine-naloxone (SUBOXONE) 8-2 mg SUBL SL tablet Reorder      Objective   Physical Exam     01/15/2024    2:10 PM 12/30/2023    2:06 PM 12/30/2023    1:56 PM  Vitals with BMI  Height 5\' 9"   5\' 9"   Weight 213 lbs 6 oz  206 lbs  BMI 31.5  30.41  Systolic 136 122 161  Diastolic 85 88 100  Pulse 90  78   Wt Readings from Last 10 Encounters:  01/15/24 213 lb 6.4 oz (96.8 kg)  12/30/23 206 lb (93.4 kg)  11/22/23 210 lb 9.6 oz (95.5 kg)  09/26/23 220 lb (99.8 kg)  08/05/23 211 lb (95.7 kg)  06/25/23 220 lb 9.6 oz (100.1 kg)  06/12/23 216 lb 3.2 oz (98.1 kg)  06/04/23 205 lb 12.8 oz (93.4 kg)  05/07/23 220 lb (99.8 kg)  03/11/23 222 lb 9.6 oz (101 kg)   Vital signs reviewed.  Nursing notes reviewed. Weight trend reviewed. Abnormalities and Problem-Specific physical exam findings:  very kind and friendly  General Appearance:  No acute distress appreciable.   Well-groomed, healthy-appearing male.  Well proportioned with no abnormal fat distribution.  Good muscle tone. Pulmonary:  Normal work of breathing at rest, no respiratory distress apparent. SpO2: 97 %  Musculoskeletal: All extremities are intact.  Neurological:  Awake, alert, oriented, and engaged.  No obvious focal neurological deficits or cognitive impairments.  Sensorium seems  unclouded.   Speech is clear and coherent with logical content. Psychiatric:  Appropriate mood, pleasant and cooperative demeanor, thoughtful and engaged during the exam    No results found for any visits on 01/15/24. Office Visit on 12/30/2023  Component Date Value   Alcohol Metabolites 12/30/2023 NEGATIVE    Amphetamines 12/30/2023 NEGATIVE    Benzodiazepines 12/30/2023 NEGATIVE    Buprenorphine, Urine 12/30/2023 POSITIVE (A)    Buprenorphine 12/30/2023 111 (H)    Norbuprenorphine 12/30/2023 656 (H)     Naloxone 12/30/2023 >2,000 (H)    Buprenorphine Comments 12/30/2023     Cocaine Metabolite 12/30/2023 POSITIVE (A)    Benzoylecgonine 12/30/2023 1,023 (H)    Cocaine Comments 12/30/2023     6 Acetylmorphine 12/30/2023 NEGATIVE    Marijuana Metabolite 12/30/2023 POSITIVE (A)    Marijuana Metabolite 12/30/2023 267 (H)    Marijuana Comments 12/30/2023     MDMA 12/30/2023 NEGATIVE    Opiates 12/30/2023 NEGATIVE    Oxycodone 12/30/2023 NEGATIVE    Creatinine 12/30/2023 109.9    pH 12/30/2023 7.5    Oxidant 12/30/2023 NEGATIVE    Notes and Comments 12/30/2023    Office Visit on 08/05/2023  Component Date Value   Amphetamines 08/05/2023 NEGATIVE    Barbiturates 08/05/2023 NEGATIVE    Benzodiazepines 08/05/2023 NEGATIVE    Cocaine Metabolite 08/05/2023 NEGATIVE    Marijuana Metabolite 08/05/2023 POSITIVE (A)    Methadone Metabolite 08/05/2023 NEGATIVE    Opiates 08/05/2023 NEGATIVE    Oxycodone 08/05/2023 NEGATIVE    Creatinine 08/05/2023 113.1    pH  08/05/2023 6.0    Oxidant 08/05/2023 NEGATIVE    Notes and Comments 08/05/2023    Lab on 05/23/2023  Component Date Value   Summary 05/23/2023 Note   No image results found. No results found.No results found.     Assessment & Plan Opioid use disorder Opioid Use Disorder with history polysubstance use.  Engaging in efforts at recovery. Currently using Suboxone, 2 tablets daily, for opioid use disorder. Starting school for  civil engineering, a positive step towards recovery. Emphasized the importance of consistent Suboxone use to avoid withdrawal and maintain functionality. Discussed Suboxone's partial agonist effect, safety profile, and ceiling effect preventing overdose. Aware of legal restrictions in [place] and plans to taper off before moving. Discussed risks of additional opioid use and the importance of staying on Suboxone for the next three years. Highlighted dangers of carfentanil and benefits of mindfulness and recovery coaching. Renew Suboxone prescription for 2 tablets daily. Encourage consistent use, reestablish with a recovery coach, start using the [employer] app, practice mindfulness techniques, and refer to additional support services if needed. Insomnia, unspecified type Insomnia Requested trazodone refill for insomnia with no new issues or side effects reported. Renew trazodone prescription. Cough variant asthma Asthma Unable to obtain Airsupra due to cost, currently using regular albuterol. Discontinue Airsupra and continue regular albuterol.   Meds ordered this encounter  Medications   buprenorphine-naloxone (SUBOXONE) 8-2 mg SUBL SL tablet    Sig: Place 2 tablets under the tongue daily.    Dispense:  28 tablet    Refill:  0   traZODone (DESYREL) 50 MG tablet    Sig: Take 1 tablet (50 mg total) by mouth at bedtime.    Dispense:  90 tablet    Refill:  1   General Health Maintenance Discussed smoking cessation and weight management. Currently using Wellbutrin for smoking cessation. Consider potential benefits of weight loss medications like Ozempic, which may also reduce substance use desire. Continue Wellbutrin for smoking cessation.  Follow-up in two weeks and upload pictures of medication use for verification.    This document was synthesized by artificial intelligence (Abridge) using HIPAA-compliant recording of the clinical interaction;   We discussed the use of AI scribe  software for clinical note transcription with the patient, who gave verbal consent to proceed.    Additional Info: This encounter employed state-of-the-art, real-time, collaborative documentation. The patient actively reviewed and assisted in updating their electronic medical record on a shared screen, ensuring transparency and facilitating joint problem-solving for the problem list, overview, and plan. This approach promotes accurate, informed care. The treatment plan was discussed and reviewed in detail, including medication safety, potential side effects, and all patient questions. We confirmed understanding and comfort with the plan. Follow-up instructions were established, including contacting the office for any concerns, returning if symptoms worsen, persist, or new symptoms develop, and precautions for potential emergency department visits.

## 2024-01-16 NOTE — Assessment & Plan Note (Signed)
Opioid Use Disorder with history polysubstance use.  Engaging in efforts at recovery. Currently using Suboxone, 2 tablets daily, for opioid use disorder. Starting school for civil engineering, a positive step towards recovery. Emphasized the importance of consistent Suboxone use to avoid withdrawal and maintain functionality. Discussed Suboxone's partial agonist effect, safety profile, and ceiling effect preventing overdose. Aware of legal restrictions in [place] and plans to taper off before moving. Discussed risks of additional opioid use and the importance of staying on Suboxone for the next three years. Highlighted dangers of carfentanil and benefits of mindfulness and recovery coaching. Renew Suboxone prescription for 2 tablets daily. Encourage consistent use, reestablish with a recovery coach, start using the [employer] app, practice mindfulness techniques, and refer to additional support services if needed.

## 2024-01-16 NOTE — Patient Instructions (Addendum)
VISIT SUMMARY:  During today's follow-up consultation, we discussed your ongoing recovery from substance use disorder and your positive steps towards personal development, including starting school for civil engineering. We reviewed your current medications and addressed your concerns about maintaining sobriety, managing insomnia, and dealing with respiratory issues. We also talked about your desire to quit smoking and explored additional strategies to support your overall well-being.  YOUR PLAN:  -OPIOID USE DISORDER: Opioid use disorder is a condition where there is a strong desire to use opioids despite harmful consequences. You are currently taking Suboxone, 2 tablets daily, to help prevent withdrawal symptoms and maintain stability. It's important to continue this medication consistently. We discussed the safety profile of Suboxone and the importance of staying on it for the next three years. You should also consider using the I Am Sober app, practicing mindfulness techniques, and reestablishing with a recovery coach. We will renew your Suboxone prescription.  -INSOMNIA: Insomnia is a condition where you have trouble falling or staying asleep. You are using Trazodone to help manage this, and you reported no new issues or side effects. We will renew your Trazodone prescription.  -ASTHMA: Asthma is a condition that affects your airways and can cause difficulty breathing. You are currently using regular Albuterol as needed. We will discontinue Airsupra due to cost and continue with regular Albuterol.  -GENERAL HEALTH MAINTENANCE: We discussed your desire to quit smoking and your current use of Wellbutrin to help with this. We also talked about weight management and the potential benefits of medications like Ozempic, which may help with weight loss and reduce the desire for substance use. Continue using Wellbutrin for smoking cessation.  INSTRUCTIONS:  Please follow up in two weeks and upload  pictures of your medication use for verification.It was a pleasure seeing you today! Your health and satisfaction are our top priorities.  Glenetta Hew, MD  Your Providers PCP: Lula Olszewski, MD,  [phone]) Referring Provider: Lula Olszewski, MD,  [phone])     NEXT STEPS: [x]  Early Intervention: Schedule sooner appointment, call our on-call services, or go to emergency room if there is any significant Increase in pain or discomfort New or worsening symptoms Sudden or severe changes in your health [x]  Flexible Follow-Up: We recommend a Return in about 2 weeks (around 01/29/2024) for chronic disease monitoring and management. for optimal routine care. This allows for progress monitoring and treatment adjustments. [x]  Preventive Care: Schedule your annual preventive care visit! It's typically covered by insurance and helps identify potential health issues early. [x]  Lab & X-ray Appointments: Incomplete tests scheduled today, or call to schedule. X-rays: [facility] (M-F, 8:30am-noon or 1pm-5pm). [x]  Medical Information Release: Sign a release form at front desk to obtain relevant medical information we don't have.  MAKING THE MOST OF OUR FOCUSED 20 MINUTE APPOINTMENTS: [x]   Clearly state your top concerns at the beginning of the visit to focus our discussion [x]   If you anticipate you will need more time, please inform the front desk during scheduling - we can book multiple appointments in the same week. [x]   If you have transportation problems- use our convenient video appointments or ask about transportation support. [x]   We can get down to business faster if you use MyChart to update information before the visit and submit non-urgent questions before your visit. Thank you for taking the time to provide details through MyChart.  Let our nurse know and she can import this information into your encounter documents.  Arrival  and Wait Times: [x]   Arriving on time  ensures that everyone receives prompt attention. [x]   Early morning (8a) and afternoon (1p) appointments tend to have shortest wait times. [x]   Unfortunately, we cannot delay appointments for late arrivals or hold slots during phone calls.  Getting Answers and Following Up [x]   Simple Questions & Concerns: For quick questions or basic follow-up after your visit, reach [place] at [phone] or MyChart messaging. [x]   Complex Concerns: If your concern is more complex, scheduling an appointment might be best. Discuss this with the staff to find the most suitable option. [x]   Lab & Imaging Results: We'll contact you directly if results are abnormal or you don't use MyChart. Most normal results will be on MyChart within 2-3 business days, with a review message from Dr. Jon Billings. Haven't heard back in 2 weeks? Need results sooner? Contact us at [phone]. [x]   Referrals: Our referral coordinator will manage specialist referrals. The specialist's office should contact you within 2 weeks to schedule an appointment. Call us if you haven't heard from them after 2 weeks.  Staying Connected [x]   MyChart: Activate your MyChart for the fastest way to access results and message [place]. See the last page of this paperwork for instructions on how to activate.  Bring to Your Next Appointment [x]   Medications: Please bring all your medication bottles to your next appointment to ensure we have an accurate record of your prescriptions. [x]   Health Diaries: If you're monitoring any health conditions at home, keeping a diary of your readings can be very helpful for discussions at your next appointment.  Billing [x]   X-ray & Lab Orders: These are billed by separate companies. Contact the invoicing company directly for questions or concerns. [x]   Visit Charges: Discuss any billing inquiries with our administrative services team.  Your Satisfaction Matters [x]   Share Your Experience: We strive for your satisfaction! If  you have any complaints, or preferably compliments, please let Dr. Jon Billings know directly or contact our Practice Administrators, Edwena Felty or [employer], by asking at the front desk.   Reviewing Your Records [x]   Review this early draft of your clinical encounter notes below and the final encounter summary tomorrow on MyChart after its been completed.  All orders placed so far are visible here: Opioid use disorder -     Buprenorphine HCl-Naloxone HCl; Place 2 tablets under the tongue daily.  Dispense: 28 tablet; Refill: 0  Insomnia, unspecified type -     traZODone HCl; Take 1 tablet (50 mg total) by mouth at bedtime.  Dispense: 90 tablet; Refill: 1  Cough variant asthma

## 2024-01-16 NOTE — Assessment & Plan Note (Signed)
Asthma Unable to obtain Airsupra due to cost, currently using regular albuterol. Discontinue Airsupra and continue regular albuterol.

## 2024-01-24 ENCOUNTER — Ambulatory Visit: Payer: Medicaid Other | Admitting: Internal Medicine

## 2024-01-29 ENCOUNTER — Encounter: Payer: Self-pay | Admitting: Internal Medicine

## 2024-01-29 ENCOUNTER — Ambulatory Visit: Payer: Medicaid Other | Admitting: Internal Medicine

## 2024-02-01 DIAGNOSIS — Z419 Encounter for procedure for purposes other than remedying health state, unspecified: Secondary | ICD-10-CM | POA: Diagnosis not present

## 2024-02-03 ENCOUNTER — Ambulatory Visit: Payer: Medicaid Other | Admitting: Internal Medicine

## 2024-02-07 ENCOUNTER — Ambulatory Visit (INDEPENDENT_AMBULATORY_CARE_PROVIDER_SITE_OTHER): Payer: Medicaid Other | Admitting: Internal Medicine

## 2024-02-07 ENCOUNTER — Encounter: Payer: Self-pay | Admitting: Internal Medicine

## 2024-02-07 VITALS — BP 128/81 | HR 82 | Temp 97.8°F | Wt 210.8 lb

## 2024-02-07 DIAGNOSIS — F909 Attention-deficit hyperactivity disorder, unspecified type: Secondary | ICD-10-CM

## 2024-02-07 DIAGNOSIS — F1190 Opioid use, unspecified, uncomplicated: Secondary | ICD-10-CM

## 2024-02-07 MED ORDER — BUPRENORPHINE HCL-NALOXONE HCL 8-2 MG SL SUBL: 2.0000 | SUBLINGUAL_TABLET | Freq: Every day | SUBLINGUAL | 0 refills | Status: AC

## 2024-02-07 NOTE — Progress Notes (Signed)
 ==============================  [facility]: [phone]   -- Medical Office Visit --  Patient: Mitchell Nelson      Age: 29 y.o.       Sex:  male  Date:   02/07/2024 Today's Healthcare Provider: Bernardino KANDICE Cone, MD  ==============================   CHIEF COMPLAINT: Follow-up  SUBJECTIVE: Background This is a 29 y.o. male who has ADD (attention deficit disorder); Cough variant asthma; Central adiposity; History of substance use disorder; Major depressive disorder with current active episode; h/o IVDU (intravenous drug user); Opioid use disorder; Tobacco use disorder; and Moderate persistent asthma without complication on their problem list.  History of Present Illness Mitchell Nelson is a 29 year old male with opioid use disorder who presents for follow-up regarding relapse management.  He experienced a relapse two days ago but has not informed his family to avoid causing them worry. This is the first time he has disclosed a relapse to someone. He acknowledges that running out of Suboxone  led to the recent relapse. He is currently taking Suboxone  and reports that he has never relapsed while on it. He is considering attending NA meetings again, which he finds helpful, although he does not discuss his Suboxone  use there due to differing views on recovery.  He is also taking bupropion  but feels sluggish when taking it, despite it being intended to increase energy. He has decided to stop taking it as it does not seem to help him. He has stopped using needles for drug use a couple of months ago and has disposed of them as they were triggers for him.  He has a history of asthma but reports no recent flares and has an inhaler available. No recent fevers or symptoms of infection.  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 He has a long-term goal of becoming a consulting civil engineer. He reports good sleep patterns, typically sleeping from midnight to 8 AM on certain days and until 10 AM on others.  Reviewed chart records that patient  has a past medical history of ADD (attention deficit disorder), Asthma, Does not have health insurance (02/11/2023), High risk medication use (12/06/2022), History of substance use disorder (10/23/2022), Opioid use disorder, severe, dependence (HCC) (07/01/2020), Other insomnia (10/23/2022), Polycythemia (06/01/2019), Substance induced mood disorder (HCC) (10/23/2022), and Wheezing (07/02/2020).  Today's Verbally Confirmed Medications - Bupropion  (Wellbutrin ) - Suboxone  - Meclizine  - Zofran  - Trazodone  Current Outpatient Medications on File Prior to Visit  Medication Sig   traZODone  (DESYREL ) 50 MG tablet Take 1 tablet (50 mg total) by mouth at bedtime.   No current facility-administered medications on file prior to visit.   Medications Discontinued During This Encounter  Medication Reason   buprenorphine -naloxone  (SUBOXONE ) 8-2 mg SUBL SL tablet Expired Prescription   buprenorphine -naloxone  (SUBOXONE ) 8-2 mg SUBL SL tablet Reorder   buPROPion  ER (WELLBUTRIN  SR) 100 MG 12 hr tablet Completed Course   meclizine  (ANTIVERT ) 25 MG tablet Completed Course   ondansetron  (ZOFRAN ) 4 MG tablet Completed Course      Objective   Physical Exam     02/07/2024    2:51 PM 01/15/2024    2:10 PM 12/30/2023    2:06 PM  Vitals with BMI  Height  5' 9   Weight 210 lbs 13 oz 213 lbs 6 oz   BMI 31.12 31.5   Systolic 128 136 877  Diastolic 81 85 88  Pulse 82 90  Wt Readings from Last 10 Encounters:  02/07/24 210 lb 12.8 oz (95.6 kg)  01/15/24 213 lb 6.4 oz (96.8 kg)  12/30/23 206 lb (93.4 kg)  11/22/23 210 lb 9.6 oz (95.5 kg)  09/26/23 220 lb (99.8 kg)  08/05/23 211 lb (95.7 kg)  06/25/23 220 lb 9.6 oz (100.1 kg)  06/12/23 216 lb 3.2 oz (98.1 kg)  06/04/23 205 lb 12.8 oz (93.4 kg)  05/07/23 220 lb (99.8 kg)   Vital signs  reviewed.  Nursing notes reviewed. Weight trend reviewed. Abnormalities and Problem-Specific physical exam findings:    General Appearance:  No acute distress appreciable.   Well-groomed, healthy-appearing male.  Well proportioned with no abnormal fat distribution.  Good muscle tone. Pulmonary:  Normal work of breathing at rest, no respiratory distress apparent. SpO2: 97 %  Musculoskeletal: All extremities are intact.  Neurological:  Awake, alert, oriented, and engaged.  No obvious focal neurological deficits or cognitive impairments.  Sensorium seems unclouded.   Speech is clear and coherent with logical content. Psychiatric:  Appropriate mood, pleasant and cooperative demeanor, thoughtful and engaged during the exam     No results found for any visits on 02/07/24. Office Visit on 12/30/2023  Component Date Value   Alcohol Metabolites 12/30/2023 NEGATIVE    Amphetamines 12/30/2023 NEGATIVE    Benzodiazepines 12/30/2023 NEGATIVE    Buprenorphine , Urine 12/30/2023 POSITIVE (A)    Buprenorphine  12/30/2023 111 (H)    Norbuprenorphine 12/30/2023 656 (H)     Naloxone  12/30/2023 >2,000 (H)    Buprenorphine  Comments 12/30/2023     Cocaine Metabolite 12/30/2023 POSITIVE (A)    Benzoylecgonine 12/30/2023 1,023 (H)    Cocaine Comments 12/30/2023     6 Acetylmorphine 12/30/2023 NEGATIVE    Marijuana Metabolite 12/30/2023 POSITIVE (A)    Marijuana Metabolite 12/30/2023 267 (H)    Marijuana Comments 12/30/2023     MDMA 12/30/2023 NEGATIVE    Opiates 12/30/2023 NEGATIVE    Oxycodone 12/30/2023 NEGATIVE    Creatinine 12/30/2023 109.9    pH 12/30/2023 7.5    Oxidant 12/30/2023 NEGATIVE    Notes and Comments 12/30/2023    Office Visit on 08/05/2023  Component Date Value   Amphetamines 08/05/2023 NEGATIVE    Barbiturates 08/05/2023 NEGATIVE    Benzodiazepines 08/05/2023 NEGATIVE    Cocaine Metabolite 08/05/2023 NEGATIVE    Marijuana Metabolite 08/05/2023 POSITIVE (A)    Methadone Metabolite  08/05/2023 NEGATIVE    Opiates 08/05/2023 NEGATIVE    Oxycodone 08/05/2023 NEGATIVE    Creatinine 08/05/2023 113.1    pH 08/05/2023 6.0    Oxidant 08/05/2023 NEGATIVE    Notes and Comments 08/05/2023    Lab on 05/23/2023  Component Date Value   Summary 05/23/2023 Note   No image results found. No results found.No results found.     Assessment & Plan Opioid use disorder Opioid Use Disorder (OUD) A recent relapse occurred two days ago. Suboxone  (buprenorphine /naloxone ) is prescribed and effectively prevents further relapse. Attendance at [facility] meetings continues, though there is hesitation to discuss Suboxone  due to stigma. Consistent use of Suboxone  is crucial to prevent relapse and overdose. The risks of relapse and the importance of not missing Suboxone  refills were discussed. Continue Suboxone  as prescribed and schedule a follow-up in two weeks. Encourage continued attendance at [employer] and emphasize the importance of not missing Suboxone  refill appointments. Attention deficit hyperactivity disorder (ADHD), unspecified ADHD type ADHD There was an inquiry about restarting Adderall, but it was advised against due to the recent relapse and the risks  associated with amphetamines in patients with active substance use disorder . Co-management by another provider is an option. The risks of amphetamine  misuse and potential exacerbation of OUD were explained. A referral to psychiatry for ADHD management is made, with a request for psychiatry to consider amphetamine  treatment for ADHD.  He doesn't find Wellbutrin  helpful.  Medication Side Effects There are reports of feeling sluggish on bupropion  (Wellbutrin ), with a possible reverse effect acknowledged. It was decided to discontinue bupropion  as it does not improve symptoms.  General Health Maintenance Sleep patterns are good, with no recent asthma flares. The I Am Sober app is used to track sobriety. Compliance with the medication regimen is  confirmed with a random pill count. Continue using the I Am Sober app and monitor for asthma symptoms, using the inhaler as needed.  Follow-up A follow-up is scheduled in two weeks, ensuring the appointment aligns with the class schedule.     Orders Placed During this Encounter:   Orders Placed This Encounter  Procedures   Ambulatory referral to Psychiatry    Referral Priority:   Routine    Referral Type:   Psychiatric    Referral Reason:   Specialty Services Required    Requested Specialty:   Psychiatry    Number of Visits Requested:   1   Meds ordered this encounter  Medications   buprenorphine -naloxone  (SUBOXONE ) 8-2 mg SUBL SL tablet    Sig: Place 2 tablets under the tongue daily.    Dispense:  28 tablet    Refill:  0    Medical Decision Making: 2 or more stable chronic illnesses Prescription drug management     This document was synthesized by artificial intelligence ([employer]bridge) using HIPAA-compliant recording of the clinical interaction;   We discussed the use of AI scribe software for clinical note transcription with the patient, who gave verbal consent to proceed.    Additional Info: This encounter employed state-of-the-art, real-time, collaborative documentation. The patient actively reviewed and assisted in updating their electronic medical record on a shared screen, ensuring transparency and facilitating joint problem-solving for the problem list, overview, and plan. This approach promotes accurate, informed care. The treatment plan was discussed and reviewed in detail, including medication safety, potential side effects, and all patient questions. We confirmed understanding and comfort with the plan. Follow-up instructions were established, including contacting the office for any concerns, returning if symptoms worsen, persist, or new symptoms develop, and precautions for potential emergency department visits.

## 2024-02-08 NOTE — Assessment & Plan Note (Signed)
 ADHD There was an inquiry about restarting Adderall, but it was advised against due to the recent relapse and the risks associated with amphetamines in patients with active substance use disorder . Co-management by another provider is an option. The risks of amphetamine  misuse and potential exacerbation of OUD were explained. A referral to psychiatry for ADHD management is made, with a request for psychiatry to consider amphetamine  treatment for ADHD.  He doesn't find Wellbutrin  helpful.

## 2024-02-08 NOTE — Assessment & Plan Note (Signed)
 Opioid Use Disorder (OUD) A recent relapse occurred two days ago. Suboxone  (buprenorphine /naloxone ) is prescribed and effectively prevents further relapse. Attendance at [facility] meetings continues, though there is hesitation to discuss Suboxone  due to stigma. Consistent use of Suboxone  is crucial to prevent relapse and overdose. The risks of relapse and the importance of not missing Suboxone  refills were discussed. Continue Suboxone  as prescribed and schedule a follow-up in two weeks. Encourage continued attendance at [employer] and emphasize the importance of not missing Suboxone  refill appointments.

## 2024-02-08 NOTE — Patient Instructions (Signed)
 VISIT SUMMARY:  Today, we discussed your recent relapse and how to manage it moving forward. We also reviewed your current medications and their effects, and talked about your general health and well-being.  YOUR PLAN:  -OPIOID USE DISORDER (OUD): Opioid Use Disorder is a condition where there is a strong desire to use opioids, even when they are causing harm. You experienced a relapse two days ago, likely due to running out of Suboxone . It is important to continue taking Suboxone  as prescribed to prevent further relapses and potential overdose. We also encourage you to keep attending NA meetings, even if you choose not to discuss your Suboxone  use there. Please make sure not to miss any Suboxone  refill appointments.  -ADHD: Attention-Deficit/Hyperactivity Disorder (ADHD) is a condition characterized by inattention, hyperactivity, and impulsiveness. We discussed the possibility of restarting Adderall, but due to the recent relapse and the risks associated with amphetamines, it was advised against. We have referred you to psychiatry for further management of your ADHD, and they will consider the best treatment options for you.  -MEDICATION SIDE EFFECTS: You reported feeling sluggish while taking bupropion  (Wellbutrin ), which is supposed to increase energy. Since it does not seem to help, we have decided to discontinue it.  -GENERAL HEALTH MAINTENANCE: Your sleep patterns are good, and there have been no recent asthma flares. You are using the [employer] app to track your sobriety, which is great. Please continue using the app and monitor for any asthma symptoms, using your inhaler as needed.  INSTRUCTIONS:  Please schedule a follow-up appointment in two weeks, making sure it fits with your class schedule. Continue taking Suboxone  as prescribed and do not miss any refill appointments. Keep attending NA meetings and use the [employer] app to track your progress. Monitor for any asthma symptoms and use  your inhaler as needed. We have also referred you to psychiatry for further management of your ADHD.   It was a pleasure seeing you today! Your health and satisfaction are our top priorities.  Bernardino Cone, MD  Your Providers PCP: Cone Bernardino MATSU, MD,  [phone]) Referring Provider: Cone Bernardino MATSU, MD,  [phone])     NEXT STEPS: [x]  Early Intervention: Schedule sooner appointment, call our on-call services, or go to emergency room if there is any significant Increase in pain or discomfort New or worsening symptoms Sudden or severe changes in your health [x]  Flexible Follow-Up: We recommend a No follow-ups on file. for optimal routine care. This allows for progress monitoring and treatment adjustments. [x]  Preventive Care: Schedule your annual preventive care visit! It's typically covered by insurance and helps identify potential health issues early. [x]  Lab & X-ray Appointments: Incomplete tests scheduled today, or call to schedule. X-rays: [facility] (M-F, 8:30am-noon or 1pm-5pm). [x]  Medical Information Release: Sign a release form at front desk to obtain relevant medical information we don't have.  MAKING THE MOST OF OUR FOCUSED 20 MINUTE APPOINTMENTS: [x]   Clearly state your top concerns at the beginning of the visit to focus our discussion [x]   If you anticipate you will need more time, please inform the front desk during scheduling - we can book multiple appointments in the same week. [x]   If you have transportation problems- use our convenient video appointments or ask about transportation support. [x]   We can get down to business faster if you use MyChart to update information before the visit and submit non-urgent questions before your visit. Thank you for taking the time  to provide details through MyChart.  Let our nurse know and she can import this information into your encounter documents.  Arrival and Wait Times: [x]   Arriving on time ensures  that everyone receives prompt attention. [x]   Early morning (8a) and afternoon (1p) appointments tend to have shortest wait times. [x]   Unfortunately, we cannot delay appointments for late arrivals or hold slots during phone calls.  Getting Answers and Following Up [x]   Simple Questions & Concerns: For quick questions or basic follow-up after your visit, reach us  at [phone] or MyChart messaging. [x]   Complex Concerns: If your concern is more complex, scheduling an appointment might be best. Discuss this with the staff to find the most suitable option. [x]   Lab & Imaging Results: We'll contact you directly if results are abnormal or you don't use MyChart. Most normal results will be on MyChart within 2-3 business days, with a review message from Dr. Jesus. Haven't heard back in 2 weeks? Need results sooner? Contact us  at [phone]. [x]   Referrals: Our referral coordinator will manage specialist referrals. The specialist's office should contact you within 2 weeks to schedule an appointment. Call us  if you haven't heard from them after 2 weeks.  Staying Connected [x]   MyChart: Activate your MyChart for the fastest way to access results and message us . See the last page of this paperwork for instructions on how to activate.  Bring to Your Next Appointment [x]   Medications: Please bring all your medication bottles to your next appointment to ensure we have an accurate record of your prescriptions. [x]   Health Diaries: If you're monitoring any health conditions at home, keeping a diary of your readings can be very helpful for discussions at your next appointment.  Billing [x]   X-ray & Lab Orders: These are billed by separate companies. Contact the invoicing company directly for questions or concerns. [x]   Visit Charges: Discuss any billing inquiries with our administrative services team.  Your Satisfaction Matters [x]   Share Your Experience: We strive for your satisfaction! If you have  any complaints, or preferably compliments, please let Dr. Jesus know directly or contact our Practice Administrators, Manuelita Rubin or [employer], by asking at the front desk.   Reviewing Your Records [x]   Review this early draft of your clinical encounter notes below and the final encounter summary tomorrow on MyChart after its been completed.  All orders placed so far are visible here: Opioid use disorder -     Buprenorphine  HCl-Naloxone  HCl; Place 2 tablets under the tongue daily.  Dispense: 28 tablet; Refill: 0 -     Ambulatory referral to Psychiatry  Attention deficit hyperactivity disorder (ADHD), unspecified ADHD type

## 2024-02-17 ENCOUNTER — Ambulatory Visit (INDEPENDENT_AMBULATORY_CARE_PROVIDER_SITE_OTHER): Payer: Medicaid Other | Admitting: Internal Medicine

## 2024-02-17 ENCOUNTER — Encounter: Payer: Self-pay | Admitting: Internal Medicine

## 2024-02-17 VITALS — BP 126/81 | HR 96 | Temp 98.2°F | Ht 69.0 in | Wt 198.4 lb

## 2024-02-17 DIAGNOSIS — F1190 Opioid use, unspecified, uncomplicated: Secondary | ICD-10-CM | POA: Diagnosis not present

## 2024-02-17 DIAGNOSIS — F1193 Opioid use, unspecified with withdrawal: Secondary | ICD-10-CM | POA: Diagnosis not present

## 2024-02-17 DIAGNOSIS — F909 Attention-deficit hyperactivity disorder, unspecified type: Secondary | ICD-10-CM

## 2024-02-17 MED ORDER — BACLOFEN 10 MG PO TABS: 10.0000 mg | ORAL_TABLET | Freq: Three times a day (TID) | ORAL | 0 refills | Status: AC

## 2024-02-17 MED ORDER — PROPRANOLOL HCL 10 MG PO TABS: 10.0000 mg | ORAL_TABLET | Freq: Three times a day (TID) | ORAL | 1 refills | Status: AC

## 2024-02-17 MED ORDER — ONDANSETRON 4 MG PO TBDP: 4.0000 mg | ORAL_TABLET | Freq: Three times a day (TID) | ORAL | 2 refills | Status: AC | PRN

## 2024-02-17 MED ORDER — CLONIDINE HCL 0.1 MG PO TABS: 0.1000 mg | ORAL_TABLET | Freq: Three times a day (TID) | ORAL | 3 refills | Status: DC

## 2024-02-17 MED ORDER — GABAPENTIN 300 MG PO CAPS: 300.0000 mg | ORAL_CAPSULE | Freq: Three times a day (TID) | ORAL | 3 refills | Status: DC

## 2024-02-17 NOTE — Assessment & Plan Note (Addendum)
Opioid Use Disorder Chronic opioid use disorder with a recent relapse presents challenges in maintaining sobriety, especially during illness or stress. Irregular Suboxone use leads to withdrawal symptoms and relapse. The role of the amygdala in addiction and the necessity of continuous effort and resilience were discussed. Methadone is considered for better craving control, acknowledging risks such as daily visits initially and difficulty switching back to Suboxone. Transcranial Magnetic Stimulation (TMS) was discussed, showing an 87% reduction in withdrawal symptoms and depression. Ketamine was also discussed for refractory depression and addiction management, noting early efficacy but lack of FDA approval for addiction treatment. Prescribe Suboxone and discuss methadone as an alternative. Refer to [facility] for TMS for withdrawal management and provide information on [facility] for comprehensive addiction treatment. Discuss ketamine treatment as a potential option. Prescribe withdrawal medications: Zofran for nausea, clonidine for anxiety and adrenaline surges, gabapentin for restlessness, propranolol for shivers, and baclofen for muscle cramps.   General Health Maintenance Encourage frequent follow-ups to support overall health and well-being, including the option for video appointments.  Follow-up Schedule follow-up appointments as needed and offer video appointments for convenience.

## 2024-02-17 NOTE — Patient Instructions (Signed)
VISIT SUMMARY:  Today, we discussed your ongoing challenges with substance use disorder and withdrawal symptoms. We reviewed your current medications and explored alternative treatments to help manage your cravings and withdrawal symptoms. We also touched on your academic performance and the impact of your symptoms on your studies.  YOUR PLAN:  -OPIOID USE DISORDER: Opioid use disorder is a condition where there is a strong desire to use opioids, difficulty in controlling their use, and continued use despite harmful consequences. We discussed the importance of regular Suboxone use to prevent withdrawal symptoms and considered methadone as an alternative for better craving control. We also talked about Transcranial Magnetic Stimulation (TMS) and ketamine as potential treatments for withdrawal and depression. You have been prescribed Suboxone, and we will discuss methadone further. Additionally, you have been prescribed medications to manage withdrawal symptoms: Zofran for nausea, clonidine for anxiety, gabapentin for restlessness, propranolol for shivers, and baclofen for muscle cramps.  -ATTENTION-DEFICIT/HYPERACTIVITY DISORDER (ADHD): ADHD is a condition characterized by symptoms of inattention, hyperactivity, and impulsivity. We are awaiting your upcoming appointment for ADHD management and will follow up on this during your next visit.  -GENERAL HEALTH MAINTENANCE: To support your overall health and well-being, we encourage frequent follow-ups, including the option for video appointments.  INSTRUCTIONS:  Please schedule follow-up appointments as needed and consider video appointments for your convenience. Additionally, you have been referred to [facility] for Transcranial Magnetic Stimulation (TMS) and provided information on [facility] for comprehensive addiction treatment. We will discuss ketamine treatment as a potential option in future visits.

## 2024-02-17 NOTE — Assessment & Plan Note (Addendum)
Attention-Deficit/Hyperactivity Disorder (ADHD) Awaiting an appointment for ADHD management with no new updates since the last visit. Follow up on the ADHD management appointment.
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
## 2024-02-24 ENCOUNTER — Ambulatory Visit: Payer: Medicaid Other | Admitting: Internal Medicine

## 2024-02-29 DIAGNOSIS — Z419 Encounter for procedure for purposes other than remedying health state, unspecified: Secondary | ICD-10-CM | POA: Diagnosis not present

## 2024-04-10 ENCOUNTER — Emergency Department (HOSPITAL_COMMUNITY)
Admission: EM | Admit: 2024-04-10 | Discharge: 2024-04-10 | Disposition: A | Discharge status: Home / Self Care | Attending: Emergency Medicine | Admitting: Emergency Medicine

## 2024-04-10 ENCOUNTER — Emergency Department (HOSPITAL_COMMUNITY)

## 2024-04-10 ENCOUNTER — Other Ambulatory Visit: Payer: Self-pay

## 2024-04-10 ENCOUNTER — Encounter (HOSPITAL_COMMUNITY): Payer: Self-pay

## 2024-04-10 DIAGNOSIS — M25511 Pain in right shoulder: Secondary | ICD-10-CM | POA: Insufficient documentation

## 2024-04-10 DIAGNOSIS — S4991XA Unspecified injury of right shoulder and upper arm, initial encounter: Secondary | ICD-10-CM | POA: Diagnosis not present

## 2024-04-10 DIAGNOSIS — Y92410 Unspecified street and highway as the place of occurrence of the external cause: Secondary | ICD-10-CM | POA: Diagnosis not present

## 2024-04-10 DIAGNOSIS — S299XXA Unspecified injury of thorax, initial encounter: Secondary | ICD-10-CM | POA: Diagnosis not present

## 2024-04-10 DIAGNOSIS — S0990XA Unspecified injury of head, initial encounter: Secondary | ICD-10-CM | POA: Diagnosis not present

## 2024-04-10 DIAGNOSIS — R1084 Generalized abdominal pain: Secondary | ICD-10-CM | POA: Insufficient documentation

## 2024-04-10 DIAGNOSIS — M25521 Pain in right elbow: Secondary | ICD-10-CM | POA: Diagnosis not present

## 2024-04-10 DIAGNOSIS — J45909 Unspecified asthma, uncomplicated: Secondary | ICD-10-CM | POA: Insufficient documentation

## 2024-04-10 DIAGNOSIS — S199XXA Unspecified injury of neck, initial encounter: Secondary | ICD-10-CM | POA: Diagnosis not present

## 2024-04-10 DIAGNOSIS — M542 Cervicalgia: Secondary | ICD-10-CM | POA: Diagnosis present

## 2024-04-10 DIAGNOSIS — S59901A Unspecified injury of right elbow, initial encounter: Secondary | ICD-10-CM | POA: Diagnosis not present

## 2024-04-10 DIAGNOSIS — S3991XA Unspecified injury of abdomen, initial encounter: Secondary | ICD-10-CM | POA: Diagnosis not present

## 2024-04-10 DIAGNOSIS — S3993XA Unspecified injury of pelvis, initial encounter: Secondary | ICD-10-CM | POA: Diagnosis not present

## 2024-04-10 LAB — BASIC METABOLIC PANEL WITH GFR
Anion gap: 10 (ref 5–15)
BUN: 7 mg/dL (ref 6–20)
CO2: 25 mmol/L (ref 22–32)
Calcium: 9.3 mg/dL (ref 8.9–10.3)
Chloride: 102 mmol/L (ref 98–111)
Creatinine, Ser: 0.85 mg/dL (ref 0.61–1.24)
GFR, Estimated: >60 mL/min (ref >60)
Glucose, Bld: 103 mg/dL — ABNORMAL HIGH (ref 70–99)
Potassium: 4.1 mmol/L (ref 3.5–5.1)
Sodium: 137 mmol/L (ref 135–145)

## 2024-04-10 LAB — CBC WITH DIFFERENTIAL/PLATELET
Abs Immature Granulocytes: 0.02 10*3/uL (ref 0.00–0.07)
Basophils Absolute: 0 10*3/uL (ref 0.0–0.1)
Basophils Relative: 0 %
Eosinophils Absolute: 0.4 10*3/uL (ref 0.0–0.5)
Eosinophils Relative: 5 %
HCT: 44.6 % (ref 39.0–52.0)
Hemoglobin: 15.3 g/dL (ref 13.0–17.0)
Immature Granulocytes: 0 %
Lymphocytes Relative: 20 %
Lymphs Abs: 1.5 10*3/uL (ref 0.7–4.0)
MCH: 29.3 pg (ref 26.0–34.0)
MCHC: 34.3 g/dL (ref 30.0–36.0)
MCV: 85.4 fL (ref 80.0–100.0)
Monocytes Absolute: 0.7 10*3/uL (ref 0.1–1.0)
Monocytes Relative: 10 %
Neutro Abs: 4.8 10*3/uL (ref 1.7–7.7)
Neutrophils Relative %: 65 %
Platelets: 240 10*3/uL (ref 150–400)
RBC: 5.22 MIL/uL (ref 4.22–5.81)
RDW: 12 % (ref 11.5–15.5)
WBC: 7.5 10*3/uL (ref 4.0–10.5)
nRBC: 0 % (ref 0.0–0.2)

## 2024-04-10 LAB — I-STAT CHEM 8, ED
BUN: 7 mg/dL (ref 6–20)
Calcium, Ion: 1.18 mmol/L (ref 1.15–1.40)
Chloride: 101 mmol/L (ref 98–111)
Creatinine, Ser: 0.9 mg/dL (ref 0.61–1.24)
Glucose, Bld: 96 mg/dL (ref 70–99)
HCT: 45 % (ref 39.0–52.0)
Hemoglobin: 15.3 g/dL (ref 13.0–17.0)
Potassium: 3.9 mmol/L (ref 3.5–5.1)
Sodium: 137 mmol/L (ref 135–145)
TCO2: 26 mmol/L (ref 22–32)

## 2024-04-10 LAB — ABO/RH: ABO/RH(D): O POS

## 2024-04-10 MED ORDER — MORPHINE SULFATE (PF) 4 MG/ML IV SOLN
4.0000 mg | Freq: Once | INTRAVENOUS | Status: AC
  Administered 2024-04-10: 4 mg via INTRAVENOUS
  Filled 2024-04-10: qty 1

## 2024-04-10 MED ORDER — ONDANSETRON HCL 4 MG/2ML IJ SOLN
4.0000 mg | INTRAMUSCULAR | Status: AC | PRN
  Administered 2024-04-10: 4 mg via INTRAVENOUS
  Filled 2024-04-10: qty 2

## 2024-04-10 MED ORDER — IOHEXOL 350 MG/ML SOLN
75.0000 mL | Freq: Once | INTRAVENOUS | Status: AC | PRN
  Administered 2024-04-10: 75 mL via INTRAVENOUS
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
## 2024-04-11 DIAGNOSIS — Z419 Encounter for procedure for purposes other than remedying health state, unspecified: Secondary | ICD-10-CM | POA: Diagnosis not present

## 2024-04-11 LAB — TYPE AND SCREEN
ABO/RH(D): O POS
Antibody Screen: NEGATIVE

## 2024-04-24 ENCOUNTER — Other Ambulatory Visit: Payer: Self-pay | Admitting: Internal Medicine

## 2024-04-24 DIAGNOSIS — J45991 Cough variant asthma: Secondary | ICD-10-CM

## 2024-06-25 ENCOUNTER — Telehealth: Payer: Self-pay | Admitting: Internal Medicine

## 2024-06-25 ENCOUNTER — Other Ambulatory Visit: Payer: Self-pay | Admitting: Internal Medicine

## 2024-06-25 DIAGNOSIS — G4700 Insomnia, unspecified: Secondary | ICD-10-CM

## 2024-06-25 DIAGNOSIS — F1193 Opioid use, unspecified with withdrawal: Secondary | ICD-10-CM

## 2024-06-25 DIAGNOSIS — F1190 Opioid use, unspecified, uncomplicated: Secondary | ICD-10-CM

## 2024-06-25 MED ORDER — CLONIDINE HCL 0.1 MG PO TABS: 0.1000 mg | ORAL_TABLET | Freq: Three times a day (TID) | ORAL | 3 refills | Status: AC

## 2024-06-25 MED ORDER — TRAZODONE HCL 50 MG PO TABS: 50.0000 mg | ORAL_TABLET | Freq: Every day | ORAL | 1 refills | Status: AC

## 2024-06-25 NOTE — Telephone Encounter (Signed)
 Copied from CRM [id number]. Topic: Clinical - Medication Refill >> Jun 25, 2024 10:01 AM Carlatta H wrote: Medication: buprenorphine -naloxone  (SUBOXONE ) 8-2 mg SUBL SL tablet gabapentin  (NEURONTIN ) 300 MG capsule   Has the patient contacted their pharmacy? No (Agent: If no, request that the patient contact the pharmacy for the refill. If patient does not wish to contact the pharmacy document the reason why and proceed with request.) (Agent: If yes, when and what did the pharmacy advise?)  This is the patient's preferred pharmacy:  [id number] [place], [place] - [address] AT [facility] [address] & [facility] [address] [place] [address] Phone: [phone] Fax: [phone]    Is this the correct pharmacy for this prescription? Yes If no, delete pharmacy and type the correct one.   Has the prescription been filled recently? No  Is the patient out of the medication? Yes  Has the patient been seen for an appointment in the last year OR does the patient have an upcoming appointment? Yes  Can we respond through MyChart? Yes  Agent: Please be advised that Rx refills may take up to 3 business days. We ask that you follow-up with your pharmacy.

## 2024-06-25 NOTE — Telephone Encounter (Signed)
 Copied from CRM [id number]. Topic: Clinical - Medication Refill >> Jun 25, 2024 10:08 AM Mesmerise C wrote: Medication:  cloNIDine  (CATAPRES ) 0.1 MG tablet  traZODone  (DESYREL ) 50 MG tablet   Has the patient contacted their pharmacy? Yes (Agent: If no, request that the patient contact the pharmacy for the refill. If patient does not wish to contact the pharmacy document the reason why and proceed with request.) (Agent: If yes, when and what did the pharmacy advise?) Told to contact Dr This is the patient's preferred pharmacy:  [id number] [place], [place] - [address] AT [facility] [address] [facility] [address] [place] [address] Phone: [phone] Fax: [phone]  Is this the correct pharmacy for this prescription? Yes If no, delete pharmacy and type the correct one.   Has the prescription been filled recently? No  Is the patient out of the medication? Yes  Has the patient been seen for an appointment in the last year OR does the patient have an upcoming appointment? Yes  Can we respond through MyChart? No  Agent: Please be advised that Rx refills may take up to 3 business days. We ask that you follow-up with your pharmacy.

## 2024-07-02 ENCOUNTER — Telehealth: Payer: Self-pay

## 2024-07-02 NOTE — Telephone Encounter (Signed)
 Copied from CRM [id number]. Topic: Clinical - Medical Advice >> Jul 02, 2024  9:51 AM Berneda FALCON wrote: Reason for CRM: Sister Siegfried would like PCP to know that he currently is in a facility inpatient rehab for the next 2 years and just completed a detox and was abusing the medications that Dr. Jesus was prescribing him. He was combining the Suboxone  along with other medications. Just finished detox last week and went to rehab and told him that his heart rate was too low so they did not allow him to have any medication. Rehab facility is reuesting sister bring the medications to them. She wants to know if this is okay or if he should no longer take them or what they should do. Given the nature of his addiction, she is not sure if he should be on medications or what they should do in this situation.  [facility] in [place], [place] [phone]  Sister Saher's contact number is [phone]

## 2024-07-24 ENCOUNTER — Ambulatory Visit: Payer: MEDICAID | Admitting: Internal Medicine

## 2024-10-21 ENCOUNTER — Other Ambulatory Visit: Payer: Self-pay | Admitting: Internal Medicine

## 2024-10-21 DIAGNOSIS — F1193 Opioid use, unspecified with withdrawal: Secondary | ICD-10-CM

## 2024-11-29 ENCOUNTER — Other Ambulatory Visit: Payer: Self-pay | Admitting: Internal Medicine

## 2024-11-29 DIAGNOSIS — F1190 Opioid use, unspecified, uncomplicated: Secondary | ICD-10-CM
